# Patient Record
Sex: Female | Born: 1995 | Race: White | Hispanic: No | Marital: Single | Smoking: Current every day smoker
Health system: Southern US, Community
[De-identification: ages and names within clinical notes are randomized; demographics above are authoritative.]

## PROBLEM LIST (undated history)

## (undated) DIAGNOSIS — F32A Depression, unspecified: Secondary | ICD-10-CM

## (undated) DIAGNOSIS — X838XXA Intentional self-harm by other specified means, initial encounter: Secondary | ICD-10-CM

## (undated) DIAGNOSIS — R110 Nausea: Principal | ICD-10-CM

## (undated) DIAGNOSIS — N926 Irregular menstruation, unspecified: Principal | ICD-10-CM

## (undated) DIAGNOSIS — R102 Pelvic and perineal pain: Secondary | ICD-10-CM

## (undated) DIAGNOSIS — F419 Anxiety disorder, unspecified: Secondary | ICD-10-CM

## (undated) DIAGNOSIS — F329 Major depressive disorder, single episode, unspecified: Secondary | ICD-10-CM

## (undated) DIAGNOSIS — R1031 Right lower quadrant pain: Secondary | ICD-10-CM

## (undated) DIAGNOSIS — R519 Headache, unspecified: Secondary | ICD-10-CM

## (undated) DIAGNOSIS — R51 Headache: Secondary | ICD-10-CM

## (undated) DIAGNOSIS — E669 Obesity, unspecified: Secondary | ICD-10-CM

## (undated) DIAGNOSIS — Z3490 Encounter for supervision of normal pregnancy, unspecified, unspecified trimester: Secondary | ICD-10-CM

## (undated) DIAGNOSIS — N912 Amenorrhea, unspecified: Principal | ICD-10-CM

## (undated) DIAGNOSIS — S8290XA Unspecified fracture of unspecified lower leg, initial encounter for closed fracture: Secondary | ICD-10-CM

## (undated) DIAGNOSIS — R350 Frequency of micturition: Secondary | ICD-10-CM

## (undated) HISTORY — DX: Right lower quadrant pain: R10.31

## (undated) HISTORY — DX: Unspecified fracture of unspecified lower leg, initial encounter for closed fracture: S82.90XA

## (undated) HISTORY — DX: Intentional self-harm by other specified means, initial encounter: X83.8XXA

## (undated) HISTORY — PX: WISDOM TOOTH EXTRACTION: SHX21

## (undated) HISTORY — DX: Irregular menstruation, unspecified: N92.6

## (undated) HISTORY — DX: Nausea: R11.0

## (undated) HISTORY — DX: Encounter for supervision of normal pregnancy, unspecified, unspecified trimester: Z34.90

## (undated) HISTORY — DX: Amenorrhea, unspecified: N91.2

## (undated) HISTORY — DX: Pelvic and perineal pain: R10.2

## (undated) HISTORY — DX: Major depressive disorder, single episode, unspecified: F32.9

## (undated) HISTORY — DX: Frequency of micturition: R35.0

## (undated) HISTORY — DX: Depression, unspecified: F32.A

---
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
## 2015-01-28 ENCOUNTER — Ambulatory Visit: Payer: Self-pay | Admitting: Nurse Practitioner

## 2015-01-30 ENCOUNTER — Encounter: Payer: Self-pay | Admitting: Nurse Practitioner

## 2015-01-30 ENCOUNTER — Ambulatory Visit (INDEPENDENT_AMBULATORY_CARE_PROVIDER_SITE_OTHER): Payer: Medicaid Other | Admitting: Nurse Practitioner

## 2015-01-30 VITALS — BP 124/80 | HR 92 | Temp 97.5°F | Ht 64.0 in | Wt 235.0 lb

## 2015-01-30 DIAGNOSIS — R1084 Generalized abdominal pain: Secondary | ICD-10-CM

## 2015-01-30 DIAGNOSIS — K2210 Ulcer of esophagus without bleeding: Secondary | ICD-10-CM | POA: Insufficient documentation

## 2015-01-30 MED ORDER — DEXLANSOPRAZOLE 60 MG PO CPDR
60.0000 mg | DELAYED_RELEASE_CAPSULE | Freq: Every day | ORAL | Status: DC
Start: 2015-01-30 — End: 2015-03-13

## 2015-01-30 NOTE — Progress Notes (Signed)
Referring Provider: Chevis Pretty, * Primary Care Physician:  Chevis Pretty, FNP Primary GI:  Dr. Gala Romney  Chief Complaint  Patient presents with  . Follow-up    HPI:   19 year old female presents for follow-up on epigastric pain, nausea, vomiting. At last visit she was referred for endoscopy which was completed on 12/12/2014 under conscious sedation. Findings include ulcerative reflux esophagitis likely responsible for GI symptoms. Advised stop Prilosec and begin Dexalone 60 mg daily, finished current prescription of Carafate, and follow-up for office visit.  Today she states she is taking Dexilant. Her abdominal pain is improved somewhat "I can finally sleep on my stomach." Is still having some residual abdominal pain across the anterior mid-abdomen. This pain is different. It is described as cramping. The pain is intermittent and is improved with a bowel movement. States she has a bowel movement about every 2 days which changed about a month ago at which point she was going about 5 times a day. No changes in medications between then and now other than PPI regimen. Her bowel movements currently are looser and require straining. Denies hemaochezia and melena. Admits vomiting one episode yesterday which is the first time since starting Dexilant. Admits new chest pain 2 days ago. Denies dyspnea, dizziness, lightheadedness, syncope, near syncope. Denies any other upper or lower GI symptoms.  Past Medical History  Diagnosis Date  . Obesity   . Broken leg     left  . Depression   . Suicide   . Amenorrhea 10/02/2014  . RLQ abdominal pain 10/02/2014    Past Surgical History  Procedure Laterality Date  . No past surgeries      Current as of 11/28/14  . Esophagogastroduodenoscopy N/A 12/12/2014    [id number] reflux     Current Outpatient Prescriptions  Medication Sig Dispense Refill  . albuterol (PROAIR HFA) 108 (90 BASE) MCG/ACT inhaler Inhale into the lungs every 6  (six) hours as needed for wheezing or shortness of breath.    . norethindrone (MICRONOR,CAMILA,ERRIN) 0.35 MG tablet 1 tablet by mouth at same time daily 1 Package 11  . omeprazole (PRILOSEC) 20 MG capsule Take 1 capsule (20 mg total) by mouth 2 (two) times daily before a meal. 30 capsule 2  . sucralfate (CARAFATE) 1 GM/10ML suspension Take 10 mLs (1 g total) by mouth 4 (four) times daily as needed. 420 mL 1   No current facility-administered medications for this visit.    Allergies as of 01/30/2015 - Review Complete 01/30/2015  Allergen Reaction Noted  . Tylenol [acetaminophen] Other (See Comments) 09/14/2013    Family History  Problem Relation Age of Onset  . Hypertension Mother   . Stroke Paternal Aunt   . Diabetes Maternal Grandmother   . Heart disease Maternal Grandmother   . Hyperlipidemia Maternal Grandmother   . Drug abuse Maternal Grandmother   . Glaucoma Maternal Grandmother   . Cataracts Maternal Grandmother   . Cancer Paternal Grandfather   . Psoriasis Sister   . Cervical cancer Maternal Aunt   . Physical abuse Maternal Uncle   . Prostate cancer Maternal Grandfather   . Alcohol abuse Maternal Grandfather   . Obesity Other   . Bipolar disorder Other     paternal aunt and cousin  . Drug abuse Other   . Other Father     heart issues  . Heart attack Father   . Colon cancer Paternal Grandfather     History   Social History  . Marital Status: Single  Spouse Name: N/A  . Number of Children: N/A  . Years of Education: N/A   Occupational History  . student     9th grade at [employer]   Social History Main Topics  . Smoking status: Former Smoker -- 1.00 packs/day for 2 years    Types: Cigarettes    Quit date: 11/27/2012  . Smokeless tobacco: Former Systems developer    Types: Chew     Comment: Currently uses vapes  . Alcohol Use: No  . Drug Use: No  . Sexual Activity:    Partners: Male    Birth Control/ Protection: Pill   Other Topics Concern  . None   Social  History Narrative    Review of Systems: General: Negative for anorexia, weight loss, fever, chills, fatigue, weakness. Eyes: Negative for vision changes.  ENT: Negative for hoarseness, difficulty swallowing. CV: Negative for chest pain, angina, palpitations, dyspnea on exertion, peripheral edema.  Respiratory: Negative for dyspnea at rest, dyspnea on exertion, cough, sputum, wheezing.  GI: See history of present illness. Endo: Negative for unusual weight change.    Physical Exam: BP 124/80 mmHg  Pulse 92  Temp(Src) 97.5 F (36.4 C)  Ht 5\' 4"  (1.626 m)  Wt 235 lb (106.595 kg)  BMI 40.32 kg/m2  LMP 12/18/2014 General:   Alert and oriented. Pleasant and cooperative. Well-nourished and well-developed.  Head:  Normocephalic and atraumatic. Eyes:  Without icterus, sclera clear and conjunctiva pink.  Ears:  Normal auditory acuity. Cardiovascular:  S1, S2 present without murmurs appreciated. Normal pulses noted. Extremities without clubbing or edema. Respiratory:  Clear to auscultation bilaterally. No wheezes, rales, or rhonchi. No distress.  Gastrointestinal:  +BS, soft, and non-distended. Minimal lower abdominal TTP noted. No HSM noted. No guarding or rebound. No masses appreciated.  Rectal:  Deferred  Neurologic:  Alert and oriented x4;  grossly normal neurologically. Psych:  Alert and cooperative. Normal mood and affect. Heme/Lymph/Immune: No excessive bruising noted.    01/30/2015 1:33 PM

## 2015-01-30 NOTE — Assessment & Plan Note (Signed)
Patient with ulcerative reflux esophagitis on EGD. She was taken off omeprazole and started on Dexilant 60 mg daily. She continues to take this. States her symptoms have essentially resolved. Her abdominal pain is in a different location and is different at this office visit. Continue Dexilant. Return for follow-up in 3 months.

## 2015-01-30 NOTE — Patient Instructions (Signed)
1. Start taking Benefiber supplement 2 teaspoons every day. 2. Continue taking Dexilant as prescribed. 3. Return for further evaluation in 3 months or sooner if symptoms worsen or become severe.

## 2015-01-30 NOTE — Assessment & Plan Note (Signed)
Patient with lower abdominal pain which is different from her epigastric pain when she was seen at last visit and subsequently scheduled for endoscopy. Currently her abdominal pain is crampy is relieved by bowel movement. She previously was having a bowel movement one to 2 times a day however currently is only having one at most every other day. Her stools are somewhat softer/loose however has a sensation of incomplete emptying. At this point we will add Benefiber supplement to attempt a to bulk her stools and allow for complete emptying. We will have her return in 3 months for further evaluation.

## 2015-01-31 NOTE — Progress Notes (Signed)
CC'ED TO PCP 

## 2015-02-12 ENCOUNTER — Telehealth: Payer: Self-pay

## 2015-02-12 MED ORDER — PANTOPRAZOLE SODIUM 40 MG PO TBEC: 40.0000 mg | DELAYED_RELEASE_TABLET | Freq: Every day | ORAL | Status: DC

## 2015-02-12 NOTE — Telephone Encounter (Signed)
Received a fax from the pharmacy, pt needs a PA done for dexilant. Pt has [place] medicaid and has only tried and failed omeprazole. Pt will have to try and fail pantoprazole prior to getting approved for dexilant.

## 2015-02-12 NOTE — Telephone Encounter (Signed)
I have sent in Protonix. Let's try that. If she does well with this, could just continue Protonix.

## 2015-02-12 NOTE — Telephone Encounter (Signed)
Pt aware. Will call us if Protonix doesn't work

## 2015-03-11 ENCOUNTER — Telehealth: Payer: Self-pay | Admitting: Adult Health

## 2015-03-11 NOTE — Telephone Encounter (Signed)
She thinks she is pregnant, to make appt for UPT, she says HPT negative

## 2015-03-13 ENCOUNTER — Ambulatory Visit (INDEPENDENT_AMBULATORY_CARE_PROVIDER_SITE_OTHER): Payer: Medicaid Other | Admitting: Adult Health

## 2015-03-13 ENCOUNTER — Encounter: Payer: Self-pay | Admitting: Adult Health

## 2015-03-13 VITALS — BP 110/62 | HR 84 | Ht 64.0 in | Wt 241.0 lb

## 2015-03-13 DIAGNOSIS — R350 Frequency of micturition: Secondary | ICD-10-CM | POA: Insufficient documentation

## 2015-03-13 DIAGNOSIS — R110 Nausea: Secondary | ICD-10-CM | POA: Diagnosis not present

## 2015-03-13 DIAGNOSIS — Z3202 Encounter for pregnancy test, result negative: Secondary | ICD-10-CM | POA: Diagnosis not present

## 2015-03-13 HISTORY — DX: Frequency of micturition: R35.0

## 2015-03-13 HISTORY — DX: Nausea: R11.0

## 2015-03-13 LAB — POCT URINALYSIS DIPSTICK
Blood, UA: NEGATIVE
GLUCOSE UA: NEGATIVE
Leukocytes, UA: NEGATIVE
NITRITE UA: NEGATIVE
Protein, UA: NEGATIVE

## 2015-03-13 LAB — POCT URINE PREGNANCY: Preg Test, Ur: NEGATIVE

## 2015-03-13 MED ORDER — PRENATAL PLUS 27-1 MG PO TABS: 1.0000 | ORAL_TABLET | Freq: Every day | ORAL | Status: DC

## 2015-03-13 NOTE — Progress Notes (Signed)
Subjective:     Patient ID: Sonya Padilla, female   DOB: 1995-10-11, 19 y.o.   MRN: [id number]  HPI Sonya Padilla is a 19 year old white female in for UPT, has had negative HPT but feels like she did when pregnant with daughter,she stopped OCs about a week ago, has nausea and occasional vomiting, urinary frequency and is hungry all the time.Has seen GI and he told her ulcer has healed.  Review of Systems Patient denies any headaches, hearing loss, fatigue, blurred vision, shortness of breath, chest pain, abdominal pain, problems with bowel movement or intercourse. No joint pain or mood swings.See HPI for positives. Reviewed past medical,surgical, social and family history. Reviewed medications and allergies.     Objective:   Physical Exam BP 110/62 mmHg  Pulse 84  Ht 5\' 4"  (1.626 m)  Wt 241 lb (109.317 kg)  BMI 41.35 kg/m2  LMP 08/27/2016urine negative, UPT negative,Skin warm and dry. Lungs: clear to ausculation bilaterally. Cardiovascular: regular rate and rhythm.Will check labs.    Assessment:     Nausea  Urinary frequency     Plan:     Check CBC,CMP,TSH and QHCG Rx prenatal plus #30 take 1 daily with 11 refills

## 2015-03-13 NOTE — Patient Instructions (Signed)
Will talk when labs back Take prenatals

## 2015-03-14 ENCOUNTER — Telehealth: Payer: Self-pay | Admitting: Adult Health

## 2015-03-14 LAB — COMPREHENSIVE METABOLIC PANEL
ALT: 19 IU/L (ref 0–32)
AST: 17 IU/L (ref 0–40)
Albumin/Globulin Ratio: 1.7 (ref 1.1–2.5)
Albumin: 4.3 g/dL (ref 3.5–5.5)
Alkaline Phosphatase: 111 IU/L — ABNORMAL HIGH (ref 43–101)
BUN/Creatinine Ratio: 13 (ref 8–20)
BUN: 9 mg/dL (ref 6–20)
Bilirubin Total: 0.4 mg/dL (ref 0.0–1.2)
CO2: 23 mmol/L (ref 18–29)
CREATININE: 0.71 mg/dL (ref 0.57–1.00)
Calcium: 9.4 mg/dL (ref 8.7–10.2)
Chloride: 101 mmol/L (ref 97–108)
GFR calc Af Amer: 144 mL/min/{1.73_m2} (ref >59)
GFR calc non Af Amer: 125 mL/min/{1.73_m2} (ref >59)
GLOBULIN, TOTAL: 2.5 g/dL (ref 1.5–4.5)
Glucose: 110 mg/dL — ABNORMAL HIGH (ref 65–99)
Potassium: 3.9 mmol/L (ref 3.5–5.2)
SODIUM: 139 mmol/L (ref 134–144)
Total Protein: 6.8 g/dL (ref 6.0–8.5)

## 2015-03-14 LAB — TSH: TSH: 2.71 u[IU]/mL (ref 0.450–4.500)

## 2015-03-14 LAB — CBC
Hematocrit: 39.2 % (ref 34.0–46.6)
Hemoglobin: 13.3 g/dL (ref 11.1–15.9)
MCH: 29.4 pg (ref 26.6–33.0)
MCHC: 33.9 g/dL (ref 31.5–35.7)
MCV: 87 fL (ref 79–97)
Platelets: 264 10*3/uL (ref 150–379)
RBC: 4.53 x10E6/uL (ref 3.77–5.28)
RDW: 13.6 % (ref 12.3–15.4)
WBC: 6.7 10*3/uL (ref 3.4–10.8)

## 2015-03-14 LAB — BETA HCG QUANT (REF LAB): hCG Quant: <1 m[IU]/mL

## 2015-03-14 NOTE — Telephone Encounter (Signed)
Spoke with pt letting her know her quant was negative. Pt voiced understanding. Sonya Padilla

## 2015-04-30 ENCOUNTER — Ambulatory Visit (INDEPENDENT_AMBULATORY_CARE_PROVIDER_SITE_OTHER): Payer: Medicaid Other | Admitting: Family Medicine

## 2015-04-30 ENCOUNTER — Encounter: Payer: Self-pay | Admitting: Family Medicine

## 2015-04-30 VITALS — BP 134/75 | HR 81 | Temp 97.4°F | Ht 64.01 in | Wt 235.0 lb

## 2015-04-30 DIAGNOSIS — N898 Other specified noninflammatory disorders of vagina: Secondary | ICD-10-CM | POA: Insufficient documentation

## 2015-04-30 DIAGNOSIS — N912 Amenorrhea, unspecified: Secondary | ICD-10-CM

## 2015-04-30 LAB — POCT WET PREP (WET MOUNT)
KOH WET PREP POC: POSITIVE
TRICHOMONAS WET PREP HPF POC: NEGATIVE

## 2015-04-30 LAB — POCT URINE PREGNANCY: Preg Test, Ur: NEGATIVE

## 2015-04-30 MED ORDER — FLUCONAZOLE 150 MG PO TABS: 150.0000 mg | ORAL_TABLET | Freq: Once | ORAL | Status: DC

## 2015-04-30 NOTE — Patient Instructions (Signed)
Great to meet you!  We will let you know about your labs within 1 week, hopefully sooner.

## 2015-04-30 NOTE — Progress Notes (Signed)
   HPI  Patient presents today with concern for pregnancy.  Patient explains that she is sexually active with her husband without any contraception. She has stopped her contraception about 2 months ago. Her last period was around September 30. She reports she has symptoms of pregnancy with breast tenderness, nausea with certain foods, morning sickness, and amenorrhea.  She had a positive pregnancy test at home 2 nights ago and had a negative test last night she comes in requesting a blood test to confirm.  He also notes 2 weeks of foul-smelling thick white vaginal discharge without much itching.  PMH: Smoking status noted ROS: Per HPI  Objective: BP 134/75 mmHg  Pulse 81  Temp(Src) 97.4 F (36.3 C) (Oral)  Ht 5' 4.01" (1.626 m)  Wt 235 lb (106.595 kg)  BMI 40.32 kg/m2 Gen: NAD, alert, cooperative with exam HEENT: NCAT CV: RRR, good S1/S2, no murmur Resp: CTABL, no wheezes, non-labored Ext: No edema, warm Neuro: Alert and oriented, No gross deficits  Assessment and plan:  # Amenorrhea, concern for Urine pregnancy test negative today Follow-up with  Beta quant with recent positive test at home.   # Vaginal discharge Wet prep positive for yeas, diflucan sent   Orders Placed This Encounter  Procedures  . Beta HCG, Quant (tumor marker)  . POCT urine pregnancy  . POCT Wet Prep [facility])    Meds ordered this encounter  Medications  . fluconazole (DIFLUCAN) 150 MG tablet    Sig: Take 1 tablet (150 mg total) by mouth once. And repeat in 3 days    Dispense:  2 tablet    Refill:  [place], MD [facility] 04/30/2015, 6:16 PM

## 2015-05-01 ENCOUNTER — Encounter: Payer: Self-pay | Admitting: Nurse Practitioner

## 2015-05-01 ENCOUNTER — Ambulatory Visit (INDEPENDENT_AMBULATORY_CARE_PROVIDER_SITE_OTHER): Payer: Medicaid Other | Admitting: Nurse Practitioner

## 2015-05-01 VITALS — BP 119/69 | HR 79 | Temp 97.8°F | Ht 65.0 in | Wt 239.8 lb

## 2015-05-01 DIAGNOSIS — K2100 Gastro-esophageal reflux disease with esophagitis, without bleeding: Secondary | ICD-10-CM

## 2015-05-01 DIAGNOSIS — K2210 Ulcer of esophagus without bleeding: Secondary | ICD-10-CM

## 2015-05-01 NOTE — Progress Notes (Signed)
Referring Provider: Chevis Pretty, * Primary Care Physician:  Chevis Pretty, FNP Primary GI:  Dr. Gala Romney  Chief Complaint  Patient presents with  . Follow-up    Doing better    HPI:   19 year old female presents for follow-up on abdominal pain and gastritis. At her last office visit her abdominal pain was resolving. We added Benefiber to promote improved stool regularity.  Today she states she's doing better. Symptoms are essentially resolved. Is on Dexilant, occasionally has breakthrough symptoms with foods that she knows will upset her symptoms but she chooses to eat them. Her breakthrough symptoms include epigastric pain. At first she states she's taking Dexilant 3 times a day, but when asked again she says "I don't know, I'm just really tired." Thinks she's pregnant, has had one positive and one negative at home test. Has had confirmation bloodwork drawn, will know in a week. Constipation improved, having daily bowel movements. No straining, denies hematochezia and melena. Takes Benefiber as needed with good result.Denies chest pain, dyspnea, dizziness, lightheadedness, syncope, near syncope. Denies any other upper or lower GI symptoms.  Past Medical History  Diagnosis Date  . Obesity   . Broken leg     left  . Depression   . Suicide ([place])   . Amenorrhea 10/02/2014  . RLQ abdominal pain 10/02/2014  . Nausea 03/13/2015  . Urinary frequency 03/13/2015    Past Surgical History  Procedure Laterality Date  . No past surgeries      Current as of 11/28/14  . Esophagogastroduodenoscopy N/A 12/12/2014    [id number] reflux     Current Outpatient Prescriptions  Medication Sig Dispense Refill  . albuterol (PROAIR HFA) 108 (90 BASE) MCG/ACT inhaler Inhale into the lungs every 6 (six) hours as needed for wheezing or shortness of breath.    . dexlansoprazole (DEXILANT) 60 MG capsule Take 60 mg by mouth daily.    . fluconazole (DIFLUCAN) 150 MG tablet Take 1 tablet (150  mg total) by mouth once. And repeat in 3 days 2 tablet 0  . pantoprazole (PROTONIX) 40 MG tablet Take 1 tablet (40 mg total) by mouth daily. (Patient not taking: Reported on 04/30/2015) 90 tablet 3   No current facility-administered medications for this visit.    Allergies as of 05/01/2015 - Review Complete 05/01/2015  Allergen Reaction Noted  . Tylenol [acetaminophen] Other (See Comments) 09/14/2013    Family History  Problem Relation Age of Onset  . Hypertension Mother   . Stroke Paternal Aunt   . Diabetes Maternal Grandmother   . Heart disease Maternal Grandmother   . Hyperlipidemia Maternal Grandmother   . Drug abuse Maternal Grandmother   . Glaucoma Maternal Grandmother   . Cataracts Maternal Grandmother   . Cancer Paternal Grandfather   . Psoriasis Sister   . Cervical cancer Maternal Aunt   . Physical abuse Maternal Uncle   . Prostate cancer Maternal Grandfather   . Alcohol abuse Maternal Grandfather   . Obesity Other   . Bipolar disorder Other     paternal aunt and cousin  . Drug abuse Other   . Other Father     heart issues  . Heart attack Father   . Colon cancer Paternal Grandfather     Social History   Social History  . Marital Status: Single    Spouse Name: N/A  . Number of Children: N/A  . Years of Education: N/A   Occupational History  . student     9th grade at  [employer]   Social History Main Topics  . Smoking status: Former Smoker -- 1.00 packs/day for 2 years    Types: Cigarettes    Quit date: 11/27/2012  . Smokeless tobacco: Former Systems developer    Types: Chew     Comment: Currently uses vapes  . Alcohol Use: No  . Drug Use: No  . Sexual Activity:    Partners: Male    Patent examiner Protection: None, Condom   Other Topics Concern  . None   Social History Narrative    Review of Systems: General: Negative for anorexia, weight loss, fever, chills, fatigue, weakness. CV: Negative for chest pain, angina, palpitations, peripheral edema.    Respiratory: Negative for dyspnea at rest, cough, sputum, wheezing.  GI: See history of present illness. Endo: Negative for unusual weight change.     Physical Exam: BP 119/69 mmHg  Pulse 79  Temp(Src) 97.8 F (36.6 C) (Oral)  Ht 5\' 5"  (1.651 m)  Wt 239 lb 12.8 oz (108.773 kg)  BMI 39.90 kg/m2  LMP 04/09/2015 General:   Alert and oriented. Obese female. Pleasant and cooperative. Well-nourished and well-developed.  Head:  Normocephalic and atraumatic. Ears:  Normal auditory acuity. Cardiovascular:  S1, S2 present without murmurs appreciated. Extremities without clubbing or edema. Respiratory:  Clear to auscultation bilaterally. No wheezes, rales, or rhonchi. No distress.  Gastrointestinal:  +BS, soft, non-tender and non-distended. No HSM noted. No guarding or rebound. No masses appreciated.  Rectal:  Deferred  Musculoskalatal:  Symmetrical without gross deformities. Normal posture. Neurologic:  Alert and oriented x4;  grossly normal neurologically. Psych:  Alert and cooperative. Normal mood and affect.    05/01/2015 2:09 PM

## 2015-05-01 NOTE — Progress Notes (Signed)
CC'ED TO PCP 

## 2015-05-01 NOTE — Patient Instructions (Signed)
1. Take Dexilant 60 mg once a day. 2. Try to avoid trigger foods aggravate your symptoms. 3. Return for follow-up in 6 months.

## 2015-05-01 NOTE — Assessment & Plan Note (Signed)
History of ulcerative esophagitis on endoscopy completed 12/12/2014 with recommendation for Dexilant 60 mg daily. Breakthrough symptoms which are good foods as noted above. Return for follow-up in 6 months.

## 2015-05-01 NOTE — Assessment & Plan Note (Signed)
History of GERD, has break through symptoms with known trigger foods that she chooses to eat. Advised her to avoid trigger foods. Take Dexon 60 mg once daily, she was unsure how often she is taking it currently. Follow-up in 6 months.

## 2015-05-02 LAB — BETA HCG QUANT (REF LAB): hCG Quant: <1 m[IU]/mL

## 2015-05-13 ENCOUNTER — Ambulatory Visit (INDEPENDENT_AMBULATORY_CARE_PROVIDER_SITE_OTHER): Payer: Medicaid Other | Admitting: Adult Health

## 2015-05-13 ENCOUNTER — Encounter: Payer: Self-pay | Admitting: Adult Health

## 2015-05-13 VITALS — BP 112/80 | HR 88 | Ht 63.4 in | Wt 239.0 lb

## 2015-05-13 DIAGNOSIS — R102 Pelvic and perineal pain: Secondary | ICD-10-CM

## 2015-05-13 DIAGNOSIS — Z3490 Encounter for supervision of normal pregnancy, unspecified, unspecified trimester: Secondary | ICD-10-CM

## 2015-05-13 DIAGNOSIS — N926 Irregular menstruation, unspecified: Secondary | ICD-10-CM

## 2015-05-13 DIAGNOSIS — Z3201 Encounter for pregnancy test, result positive: Secondary | ICD-10-CM

## 2015-05-13 DIAGNOSIS — O3680X0 Pregnancy with inconclusive fetal viability, not applicable or unspecified: Secondary | ICD-10-CM

## 2015-05-13 HISTORY — DX: Irregular menstruation, unspecified: N92.6

## 2015-05-13 HISTORY — DX: Pelvic and perineal pain: R10.2

## 2015-05-13 HISTORY — DX: Encounter for supervision of normal pregnancy, unspecified, unspecified trimester: Z34.90

## 2015-05-13 LAB — POCT URINE PREGNANCY: Preg Test, Ur: POSITIVE — AB

## 2015-05-13 MED ORDER — CITRANATAL 90 DHA 90-1 & 300 MG PO MISC: ORAL | Status: DC

## 2015-05-13 NOTE — Progress Notes (Signed)
Subjective:     Patient ID: Sonya Padilla, female   DOB: Aug 21, 1995, 20 y.o.   MRN: [id number]  HPI Sonya Padilla is a 19 year old white female in complaining of irregular periods had 2 in September, but none in October, was seen at [facility] and had negative UPT,has had 1 + and 1 negative at home.Has some low pelvic pain.  Review of Systems Patient denies any headaches, hearing loss, fatigue, blurred vision, shortness of breath, chest pain,  problems with bowel movements, urination, or intercourse. No joint pain or mood swings.See HPI for positives. Reviewed past medical,surgical, social and family history. Reviewed medications and allergies.     Objective:   Physical Exam BP 112/80 mmHg  Pulse 88  Ht 5' 3.4" (1.61 m)  Wt 239 lb (108.41 kg)  BMI 41.82 kg/m2  LMP 04/09/2015 UPT+ Skin warm and dry. Neck: mid line trachea, normal thyroid, good ROM, no lymphadenopathy noted. Lungs: clear to ausculation bilaterally. Cardiovascular: regular rate and rhythm.Abdomen soft, no HSM but tender across low pelvic area,about 4+6 weeks by LMP with EDD 01/14/16.    Assessment:     Irregular periods Pregnant  Pelvic pain    Plan:     Check QHCG Return in 2 weeks for dating [place] Rx citranatal 90 DHA #60 1 bid with 11 refills Review handout on first trimester

## 2015-05-13 NOTE — Patient Instructions (Signed)
First Trimester of Pregnancy The first trimester of pregnancy is from week 1 until the end of week 12 (months 1 through 3). A week after a sperm fertilizes an egg, the egg will implant on the wall of the uterus. This embryo will begin to develop into a baby. Genes from you and your partner are forming the baby. The female genes determine whether the baby is a boy or a girl. At 6-8 weeks, the eyes and face are formed, and the heartbeat can be seen on ultrasound. At the end of 12 weeks, all the baby's organs are formed.  Now that you are pregnant, you will want to do everything you can to have a healthy baby. Two of the most important things are to get good prenatal care and to follow your health care provider's instructions. Prenatal care is all the medical care you receive before the baby's birth. This care will help prevent, find, and treat any problems during the pregnancy and childbirth. BODY CHANGES Your body goes through many changes during pregnancy. The changes vary from woman to woman.   You may gain or lose a couple of pounds at first.  You may feel sick to your stomach (nauseous) and throw up (vomit). If the vomiting is uncontrollable, call your health care provider.  You may tire easily.  You may develop headaches that can be relieved by medicines approved by your health care provider.  You may urinate more often. Painful urination may mean you have a bladder infection.  You may develop heartburn as a result of your pregnancy.  You may develop constipation because certain hormones are causing the muscles that push waste through your intestines to slow down.  You may develop hemorrhoids or swollen, bulging veins (varicose veins).  Your breasts may begin to grow larger and become tender. Your nipples may stick out more, and the tissue that surrounds them (areola) may become darker.  Your gums may bleed and may be sensitive to brushing and flossing.  Dark spots or blotches (chloasma,  mask of pregnancy) may develop on your face. This will likely fade after the baby is born.  Your menstrual periods will stop.  You may have a loss of appetite.  You may develop cravings for certain kinds of food.  You may have changes in your emotions from day to day, such as being excited to be pregnant or being concerned that something may go wrong with the pregnancy and baby.  You may have more vivid and strange dreams.  You may have changes in your hair. These can include thickening of your hair, rapid growth, and changes in texture. Some women also have hair loss during or after pregnancy, or hair that feels dry or thin. Your hair will most likely return to normal after your baby is born. WHAT TO EXPECT AT YOUR PRENATAL VISITS During a routine prenatal visit:  You will be weighed to make sure you and the baby are growing normally.  Your blood pressure will be taken.  Your abdomen will be measured to track your baby's growth.  The fetal heartbeat will be listened to starting around week 10 or 12 of your pregnancy.  Test results from any previous visits will be discussed. Your health care provider may ask you:  How you are feeling.  If you are feeling the baby move.  If you have had any abnormal symptoms, such as leaking fluid, bleeding, severe headaches, or abdominal cramping.  If you are using any tobacco products,   including cigarettes, chewing tobacco, and electronic cigarettes.  If you have any questions. Other tests that may be performed during your first trimester include:  Blood tests to find your blood type and to check for the presence of any previous infections. They will also be used to check for low iron levels (anemia) and Rh antibodies. Later in the pregnancy, blood tests for diabetes will be done along with other tests if problems develop.  Urine tests to check for infections, diabetes, or protein in the urine.  An ultrasound to confirm the proper growth  and development of the baby.  An amniocentesis to check for possible genetic problems.  Fetal screens for spina bifida and Down syndrome.  You may need other tests to make sure you and the baby are doing well.  HIV (human immunodeficiency virus) testing. Routine prenatal testing includes screening for HIV, unless you choose not to have this test. HOME CARE INSTRUCTIONS  Medicines  Follow your health care provider's instructions regarding medicine use. Specific medicines may be either safe or unsafe to take during pregnancy.  Take your prenatal vitamins as directed.  If you develop constipation, try taking a stool softener if your health care provider approves. Diet  Eat regular, well-balanced meals. Choose a variety of foods, such as meat or vegetable-based protein, fish, milk and low-fat dairy products, vegetables, fruits, and whole grain breads and cereals. Your health care provider will help you determine the amount of weight gain that is right for you.  Avoid raw meat and uncooked cheese. These carry germs that can cause birth defects in the baby.  Eating four or five small meals rather than three large meals a day may help relieve nausea and vomiting. If you start to feel nauseous, eating a few soda crackers can be helpful. Drinking liquids between meals instead of during meals also seems to help nausea and vomiting.  If you develop constipation, eat more high-fiber foods, such as fresh vegetables or fruit and whole grains. Drink enough fluids to keep your urine clear or pale yellow. Activity and Exercise  Exercise only as directed by your health care provider. Exercising will help you:  Control your weight.  Stay in shape.  Be prepared for labor and delivery.  Experiencing pain or cramping in the lower abdomen or low back is a good sign that you should stop exercising. Check with your health care provider before continuing normal exercises.  Try to avoid standing for long  periods of time. Move your legs often if you must stand in one place for a long time.  Avoid heavy lifting.  Wear low-heeled shoes, and practice good posture.  You may continue to have sex unless your health care provider directs you otherwise. Relief of Pain or Discomfort  Wear a good support bra for breast tenderness.   Take warm sitz baths to soothe any pain or discomfort caused by hemorrhoids. Use hemorrhoid cream if your health care provider approves.   Rest with your legs elevated if you have leg cramps or low back pain.  If you develop varicose veins in your legs, wear support hose. Elevate your feet for 15 minutes, 3-4 times a day. Limit salt in your diet. Prenatal Care  Schedule your prenatal visits by the twelfth week of pregnancy. They are usually scheduled monthly at first, then more often in the last 2 months before delivery.  Write down your questions. Take them to your prenatal visits.  Keep all your prenatal visits as directed by your   health care provider. Safety  Wear your seat belt at all times when driving.  Make a list of emergency phone numbers, including numbers for family, friends, the hospital, and police and fire departments. General Tips  Ask your health care provider for a referral to a local prenatal education class. Begin classes no later than at the beginning of month 6 of your pregnancy.  Ask for help if you have counseling or nutritional needs during pregnancy. Your health care provider can offer advice or refer you to specialists for help with various needs.  Do not use hot tubs, steam rooms, or saunas.  Do not douche or use tampons or scented sanitary pads.  Do not cross your legs for long periods of time.  Avoid cat litter boxes and soil used by cats. These carry germs that can cause birth defects in the baby and possibly loss of the fetus by miscarriage or stillbirth.  Avoid all smoking, herbs, alcohol, and medicines not prescribed by  your health care provider. Chemicals in these affect the formation and growth of the baby.  Do not use any tobacco products, including cigarettes, chewing tobacco, and electronic cigarettes. If you need help quitting, ask your health care provider. You may receive counseling support and other resources to help you quit.  Schedule a dentist appointment. At home, brush your teeth with a soft toothbrush and be gentle when you floss. SEEK MEDICAL CARE IF:   You have dizziness.  You have mild pelvic cramps, pelvic pressure, or nagging pain in the abdominal area.  You have persistent nausea, vomiting, or diarrhea.  You have a bad smelling vaginal discharge.  You have pain with urination.  You notice increased swelling in your face, hands, legs, or ankles. SEEK IMMEDIATE MEDICAL CARE IF:   You have a fever.  You are leaking fluid from your vagina.  You have spotting or bleeding from your vagina.  You have severe abdominal cramping or pain.  You have rapid weight gain or loss.  You vomit blood or material that looks like coffee grounds.  You are exposed to [place] measles and have never had them.  You are exposed to fifth disease or chickenpox.  You develop a severe headache.  You have shortness of breath.  You have any kind of trauma, such as from a fall or a car accident.   This information is not intended to replace advice given to you by your health care provider. Make sure you discuss any questions you have with your health care provider.   Document Released: 06/23/2001 Document Revised: 07/20/2014 Document Reviewed: 05/09/2013 Elsevier Interactive Patient Education [employer]. Return in 2 weeks for dating [place]

## 2015-05-14 ENCOUNTER — Telehealth: Payer: Self-pay | Admitting: Adult Health

## 2015-05-14 DIAGNOSIS — Z3490 Encounter for supervision of normal pregnancy, unspecified, unspecified trimester: Secondary | ICD-10-CM

## 2015-05-14 LAB — BETA HCG QUANT (REF LAB): hCG Quant: 39 m[IU]/mL

## 2015-05-14 NOTE — Telephone Encounter (Signed)
left message QHCG 39 recheck in 48 hours

## 2015-05-20 ENCOUNTER — Telehealth: Payer: Self-pay | Admitting: Adult Health

## 2015-05-20 NOTE — Telephone Encounter (Signed)
She is having cramping no bleeding and is getting labs this pm,push fluids and she has cough and congestion,can use zyrtec,tylenol and any cough drop and salt water gargles.

## 2015-05-21 ENCOUNTER — Telehealth: Payer: Self-pay | Admitting: Adult Health

## 2015-05-21 LAB — BETA HCG QUANT (REF LAB): hCG Quant: 974 m[IU]/mL

## 2015-05-21 NOTE — Telephone Encounter (Signed)
Pt aware of [facility]

## 2015-05-29 ENCOUNTER — Other Ambulatory Visit: Payer: Self-pay | Admitting: Adult Health

## 2015-05-29 ENCOUNTER — Ambulatory Visit (INDEPENDENT_AMBULATORY_CARE_PROVIDER_SITE_OTHER): Payer: Medicaid Other

## 2015-05-29 DIAGNOSIS — O3680X0 Pregnancy with inconclusive fetal viability, not applicable or unspecified: Secondary | ICD-10-CM

## 2015-05-29 NOTE — Progress Notes (Signed)
[place] 6+1wks,single IUP w/ys,pos fht 100 bpm,normal ov's bilat, simple lt corpus luteal cyst 2.3 x 2 x 1.9cm ,crl 5.47mm,pt will come back in 1 wks to recheck FHR per [place].

## 2015-06-03 ENCOUNTER — Telehealth: Payer: Self-pay | Admitting: Nurse Practitioner

## 2015-06-05 ENCOUNTER — Other Ambulatory Visit: Payer: Self-pay | Admitting: Obstetrics & Gynecology

## 2015-06-05 ENCOUNTER — Telehealth: Payer: Self-pay | Admitting: *Deleted

## 2015-06-05 ENCOUNTER — Ambulatory Visit (INDEPENDENT_AMBULATORY_CARE_PROVIDER_SITE_OTHER): Payer: Medicaid Other

## 2015-06-05 DIAGNOSIS — O3680X0 Pregnancy with inconclusive fetal viability, not applicable or unspecified: Secondary | ICD-10-CM

## 2015-06-05 MED ORDER — ONDANSETRON 8 MG PO TBDP: 8.0000 mg | ORAL_TABLET | Freq: Three times a day (TID) | ORAL | Status: DC | PRN

## 2015-06-05 NOTE — Progress Notes (Addendum)
[place] 7+1wks single IUP w/ys,pos fht 99 bpm,normal rt ov,lt simple corpus luteal cyst 2.4 x 1.7 x 1.8cm,pt does not need a f/u ultrasound for fhr,per Dr. Elonda Husky.

## 2015-06-12 ENCOUNTER — Encounter: Payer: Self-pay | Admitting: Women's Health

## 2015-06-12 ENCOUNTER — Ambulatory Visit (INDEPENDENT_AMBULATORY_CARE_PROVIDER_SITE_OTHER): Payer: Medicaid Other | Admitting: Women's Health

## 2015-06-12 VITALS — BP 122/64 | HR 72 | Ht 65.0 in | Wt 242.0 lb

## 2015-06-12 DIAGNOSIS — Z3682 Encounter for antenatal screening for nuchal translucency: Secondary | ICD-10-CM

## 2015-06-12 DIAGNOSIS — Z369 Encounter for antenatal screening, unspecified: Secondary | ICD-10-CM

## 2015-06-12 DIAGNOSIS — Z1389 Encounter for screening for other disorder: Secondary | ICD-10-CM

## 2015-06-12 DIAGNOSIS — Z0283 Encounter for blood-alcohol and blood-drug test: Secondary | ICD-10-CM

## 2015-06-12 DIAGNOSIS — Z3491 Encounter for supervision of normal pregnancy, unspecified, first trimester: Secondary | ICD-10-CM | POA: Diagnosis not present

## 2015-06-12 DIAGNOSIS — Z3A08 8 weeks gestation of pregnancy: Secondary | ICD-10-CM | POA: Diagnosis not present

## 2015-06-12 DIAGNOSIS — Z3490 Encounter for supervision of normal pregnancy, unspecified, unspecified trimester: Secondary | ICD-10-CM | POA: Insufficient documentation

## 2015-06-12 DIAGNOSIS — Z331 Pregnant state, incidental: Secondary | ICD-10-CM

## 2015-06-12 LAB — POCT URINALYSIS DIPSTICK
Glucose, UA: NEGATIVE
KETONES UA: NEGATIVE
Leukocytes, UA: NEGATIVE
Nitrite, UA: NEGATIVE
PROTEIN UA: NEGATIVE
RBC UA: NEGATIVE

## 2015-06-12 NOTE — Telephone Encounter (Signed)
rx was sent to pharmacy

## 2015-06-12 NOTE — Patient Instructions (Signed)

## 2015-06-12 NOTE — Progress Notes (Signed)
Subjective:  Sonya Padilla is a 19 y.o. G37P1001 Caucasian female at [redacted]w[redacted]d by 6wk u/s, being seen today for her first obstetrical visit.  Her obstetrical history is significant for term uncomplicated svb x 1 [id number].  H/o major depressive disorder w/ suicidal ideations in past,  PTSD-on no meds currently- denies any problems w/ depression or suicidal ideations currently. Pregnancy history fully reviewed. Reports FOB born w/ heart defect- unsure of what type- with further questioning sounds like possible bicuspid aortic valve- not followed by cardiology.   Patient reports n/v- has zofran that was previously rx'd by [facility]. Denies vb, cramping, uti s/s, abnormal/malodorous vag d/c, or vulvovaginal itching/irritation.  BP 122/64 mmHg  Pulse 72  Ht 5\' 5"  (1.651 m)  Wt 242 lb (109.77 kg)  BMI 40.27 kg/m2  LMP 04/09/2015 (Exact Date)  HISTORY: OB History  Gravida Para Term Preterm AB SAB TAB Ectopic Multiple Living  2 1 1       1     # Outcome Date GA Lbr Len/2nd Weight Sex Delivery Anes PTL Lv  2 Current           1 Term 04/17/14 [redacted]w[redacted]d  6 lb 15 oz (3.147 kg) F Vag-Spont EPI N Y     Past Medical History  Diagnosis Date  . Obesity   . Broken leg     left  . Depression   . Suicide ([place])   . Amenorrhea 10/02/2014  . RLQ abdominal pain 10/02/2014  . Nausea 03/13/2015  . Urinary frequency 03/13/2015  . Irregular periods 05/13/2015  . Pelvic pain in female 05/13/2015  . Pregnant 05/13/2015   Past Surgical History  Procedure Laterality Date  . No past surgeries      Current as of 11/28/14  . Esophagogastroduodenoscopy N/A 12/12/2014    [id number] reflux    Family History  Problem Relation Age of Onset  . Hypertension Mother   . Stroke Paternal Aunt   . Diabetes Maternal Grandmother   . Heart disease Maternal Grandmother   . Hyperlipidemia Maternal Grandmother   . Drug abuse Maternal Grandmother   . Glaucoma Maternal Grandmother   . Cataracts Maternal Grandmother   .  Cancer Paternal Grandfather   . Psoriasis Sister   . Cervical cancer Maternal Aunt   . Physical abuse Maternal Uncle   . Prostate cancer Maternal Grandfather   . Alcohol abuse Maternal Grandfather   . Obesity Other   . Bipolar disorder Other     paternal aunt and cousin  . Drug abuse Other   . Other Father     heart issues  . Heart attack Father   . Colon cancer Paternal Grandfather     Exam   System:     General: Well developed & nourished, no acute distress   Skin: Warm & dry, normal coloration and turgor, no rashes   Neurologic: Alert & oriented, normal mood   Cardiovascular: Regular rate & rhythm   Respiratory: Effort & rate normal, LCTAB, acyanotic   Abdomen: Soft, non tender   Extremities: normal strength, tone   Thin prep pap smear <21yo    Assessment:   Pregnancy: G2P1001 Patient Active Problem List   Diagnosis Date Noted  . Supervision of normal pregnancy 06/12/2015    Priority: High  . Rh negative state in antepartum period 10/25/2013    Priority: High  . Obesity (BMI 30-39.9) 10/24/2013    Priority: High  . Smoker 10/24/2013    Priority: High  . Marijuana  use 10/24/2013    Priority: High  . Major depression w/ hx of suicidal thoughts 08/06/2011    Priority: High  . Post traumatic stress disorder 08/06/2011    Priority: High  . Irregular periods 05/13/2015  . Pelvic pain in female 05/13/2015  . Pregnant 05/13/2015  . Vaginal discharge 04/30/2015  . Nausea 03/13/2015  . Urinary frequency 03/13/2015  . Ulcerative esophagitis 01/30/2015  . Abdominal pain 01/30/2015  . Gastroesophageal reflux disease with esophagitis   . Abdominal pain, epigastric 11/28/2014  . Bowel habit changes 11/28/2014  . Nausea without vomiting 11/28/2014  . Amenorrhea 10/02/2014  . RLQ abdominal pain 10/02/2014  . Migraines w/ aura 07/18/2014    [redacted]w[redacted]d G2P1001 New OB visit N/V H/O PTSD, depression, suicidal ideations- denies any presently, no meds  Plan:  Initial  labs drawn Continue prenatal vitamins Problem list reviewed and updated Reviewed n/v relief measures and warning s/s to report Reviewed recommended weight gain based on pre-gravid BMI Encouraged well-balanced diet Genetic Screening discussed Integrated Screen: requested Cystic fibrosis screening discussed results reviewed, neg last preg Ultrasound discussed; fetal survey: requested Follow up in 4 weeks for 1st it/nt and visit [place] completed Will get fetal echo around 24-28wks d/t fob having heart defect  [name] CNM, [facility] 06/12/2015 3:36 PM

## 2015-06-13 LAB — PMP SCREEN PROFILE (10S), URINE
Amphetamine Screen, Ur: NEGATIVE ng/mL
BENZODIAZEPINE SCREEN, URINE: NEGATIVE ng/mL
Barbiturate Screen, Ur: NEGATIVE ng/mL
CANNABINOIDS UR QL SCN: NEGATIVE ng/mL
COCAINE(METAB.) SCREEN, URINE: NEGATIVE ng/mL
Creatinine(Crt), U: 106.8 mg/dL (ref 20.0–300.0)
Methadone Scn, Ur: NEGATIVE ng/mL
OXYCODONE+OXYMORPHONE UR QL SCN: NEGATIVE ng/mL
Opiate Scrn, Ur: NEGATIVE ng/mL
PCP Scrn, Ur: NEGATIVE ng/mL
PH UR, DRUG SCRN: 5.7 (ref 4.5–8.9)
Propoxyphene, Screen: NEGATIVE ng/mL

## 2015-06-13 LAB — CBC
HEMOGLOBIN: 12.6 g/dL (ref 11.1–15.9)
Hematocrit: 37.5 % (ref 34.0–46.6)
MCH: 29.9 pg (ref 26.6–33.0)
MCHC: 33.6 g/dL (ref 31.5–35.7)
MCV: 89 fL (ref 79–97)
Platelets: 216 10*3/uL (ref 150–379)
RBC: 4.22 x10E6/uL (ref 3.77–5.28)
RDW: 14 % (ref 12.3–15.4)
WBC: 9.1 10*3/uL (ref 3.4–10.8)

## 2015-06-13 LAB — GC/CHLAMYDIA PROBE AMP
CHLAMYDIA, DNA PROBE: NEGATIVE
Neisseria gonorrhoeae by PCR: NEGATIVE

## 2015-06-13 LAB — URINALYSIS, ROUTINE W REFLEX MICROSCOPIC
BILIRUBIN UA: NEGATIVE
Glucose, UA: NEGATIVE
Ketones, UA: NEGATIVE
Nitrite, UA: NEGATIVE
PH UA: 6.5 (ref 5.0–7.5)
Protein, UA: NEGATIVE
RBC UA: NEGATIVE
Specific Gravity, UA: 1.018 (ref 1.005–1.030)
Urobilinogen, Ur: 0.2 mg/dL (ref 0.2–1.0)

## 2015-06-13 LAB — MICROSCOPIC EXAMINATION: Casts: NONE SEEN /lpf

## 2015-06-13 LAB — ANTIBODY SCREEN: ANTIBODY SCREEN: NEGATIVE

## 2015-06-13 LAB — VARICELLA ZOSTER ANTIBODY, IGG: Varicella zoster IgG: 2930 index (ref >165)

## 2015-06-13 LAB — RUBELLA SCREEN: RUBELLA: 4.58 {index} (ref >0.99)

## 2015-06-13 LAB — HEPATITIS B SURFACE ANTIGEN: HEP B S AG: NEGATIVE

## 2015-06-13 LAB — ABO/RH: RH TYPE: NEGATIVE

## 2015-06-13 LAB — RPR: RPR: NONREACTIVE

## 2015-06-13 LAB — HIV ANTIBODY (ROUTINE TESTING W REFLEX): HIV SCREEN 4TH GENERATION: NONREACTIVE

## 2015-06-14 LAB — URINE CULTURE

## 2015-06-18 ENCOUNTER — Telehealth: Payer: Self-pay | Admitting: *Deleted

## 2015-06-18 NOTE — Telephone Encounter (Signed)
Pt c/o burning with urination. Pt requesting medication. Pt informed to push lots of water and cranberry juice, offered pt an appt today but cannot come. Appt scheduled for tomorrow for evaluation.

## 2015-06-19 ENCOUNTER — Telehealth: Payer: Self-pay | Admitting: Women's Health

## 2015-06-19 ENCOUNTER — Ambulatory Visit (INDEPENDENT_AMBULATORY_CARE_PROVIDER_SITE_OTHER): Payer: Medicaid Other | Admitting: Advanced Practice Midwife

## 2015-06-19 VITALS — BP 100/66 | HR 80 | Wt 240.0 lb

## 2015-06-19 DIAGNOSIS — Z1389 Encounter for screening for other disorder: Secondary | ICD-10-CM

## 2015-06-19 DIAGNOSIS — Z3491 Encounter for supervision of normal pregnancy, unspecified, first trimester: Secondary | ICD-10-CM

## 2015-06-19 DIAGNOSIS — Z3A09 9 weeks gestation of pregnancy: Secondary | ICD-10-CM

## 2015-06-19 DIAGNOSIS — Z331 Pregnant state, incidental: Secondary | ICD-10-CM

## 2015-06-19 DIAGNOSIS — R300 Dysuria: Secondary | ICD-10-CM

## 2015-06-19 LAB — POCT URINALYSIS DIPSTICK
Blood, UA: 1
GLUCOSE UA: NEGATIVE
Ketones, UA: NEGATIVE
NITRITE UA: NEGATIVE
Protein, UA: 2

## 2015-06-19 MED ORDER — CEPHALEXIN 500 MG PO CAPS: 500.0000 mg | ORAL_CAPSULE | Freq: Three times a day (TID) | ORAL | Status: DC

## 2015-06-19 NOTE — Progress Notes (Signed)
G2P1001 [redacted]w[redacted]d Estimated Date of Delivery: 01/21/16  Blood pressure 100/66, pulse 80, weight 240 lb (108.863 kg), last menstrual period 04/09/2015, not currently breastfeeding.   WORK IN FOR DYSURIA, LBP, PRESSURE WHEN VOIDING. No fever or CVAT.  BP weight and urine results all reviewed and noted.  Please refer to the obstetrical flow sheet for the fundal height and fetal heart rate documentation:  Patient denies any bleeding and no rupture of membranes symptoms or regular contractions. Patient is without complaints. All questions were answered.  Orders Placed This Encounter  Procedures  . Urine culture  . POCT urinalysis dipstick    Plan:  Continued routine obstetrical care, RX Keflex 500mg  TID X 7.  Pyelo warning given.  Culture urine  Return for As scheduled.

## 2015-06-19 NOTE — Patient Instructions (Signed)
If you get a fever greater than 100.4 (twice 30 minutes apart), let us know.  If it is after hours or the weekend, go to the hospital, as you will most likely need inpatient IV antibiotics.

## 2015-06-19 NOTE — Telephone Encounter (Signed)
Had message to call pt, that she was upset she was called by a nurse and not me and was rude to front office staff. Pt thinks she has uti and requests rx w/o being seen. Notified her she has to come give [place] a specimen so it can be cultured. Culture is no good after antibiotic started. Pt states she will keep appt today, reluctantly.  Roma Schanz, CNM, [facility] 06/19/2015 1:18 PM

## 2015-06-22 LAB — URINE CULTURE

## 2015-06-26 ENCOUNTER — Telehealth: Payer: Self-pay | Admitting: Adult Health

## 2015-06-26 ENCOUNTER — Telehealth: Payer: Self-pay | Admitting: Women's Health

## 2015-06-26 MED ORDER — DOXYLAMINE-PYRIDOXINE 10-10 MG PO TBEC: DELAYED_RELEASE_TABLET | ORAL | Status: DC

## 2015-06-26 MED ORDER — PROMETHAZINE HCL 25 MG PO TABS: 25.0000 mg | ORAL_TABLET | Freq: Four times a day (QID) | ORAL | Status: DC | PRN

## 2015-06-26 NOTE — Telephone Encounter (Signed)
Complains of nausea and vomiting and zofran not helping, can't afford diclegis, will rx phenergan

## 2015-06-26 NOTE — Telephone Encounter (Signed)
Pt called stating that the medication Shamyia has prescribed her is to expensive and she can't afford it. Pt would like another prescription called in that she could afford. Please contact pt

## 2015-06-26 NOTE — Telephone Encounter (Signed)
Pt called stating that she would like a phone call from [place], [place] did not state the reason why. Please contact pt

## 2015-06-26 NOTE — Telephone Encounter (Signed)
Pt states Zofran not helping would like a RX for Diclegis.

## 2015-06-26 NOTE — Telephone Encounter (Signed)
Will rx diclegis  

## 2015-06-26 NOTE — Telephone Encounter (Signed)
Informed pt had samples of Diclegis but pt states could not come to [place] to pick up.   Pt states Diclegis to expensive is there an alternative?

## 2015-06-26 NOTE — Telephone Encounter (Signed)
Duplicate message. 

## 2015-06-27 ENCOUNTER — Telehealth: Payer: Self-pay | Admitting: Adult Health

## 2015-06-27 NOTE — Telephone Encounter (Signed)
Spoke with pt. Pt states she talked to her caseworker and she has a new ID #. ID# is [id number] P. I spoke with [place] tried to put the new ID# in and it's still showing non active. Dawn to call pt. [place]

## 2015-07-01 ENCOUNTER — Telehealth: Payer: Self-pay | Admitting: Adult Health

## 2015-07-01 NOTE — Telephone Encounter (Signed)
Pt called stating that she would like a call back from [place], [place] did not state the reason why. Please contact pt

## 2015-07-01 NOTE — Telephone Encounter (Signed)
Left message x 1. JSY 

## 2015-07-01 NOTE — Telephone Encounter (Signed)
Spoke with pt letting her know Medicaid is still showing not active for December per [facility]. Pt needs to talk to case worker again. Call transferred to front desk to reschedule appt. [place]

## 2015-07-10 ENCOUNTER — Ambulatory Visit (INDEPENDENT_AMBULATORY_CARE_PROVIDER_SITE_OTHER): Payer: Medicaid Other

## 2015-07-10 ENCOUNTER — Ambulatory Visit (INDEPENDENT_AMBULATORY_CARE_PROVIDER_SITE_OTHER): Payer: Medicaid Other | Admitting: Women's Health

## 2015-07-10 ENCOUNTER — Encounter: Payer: Self-pay | Admitting: Women's Health

## 2015-07-10 VITALS — BP 104/60 | HR 84 | Wt 235.0 lb

## 2015-07-10 DIAGNOSIS — Z331 Pregnant state, incidental: Secondary | ICD-10-CM

## 2015-07-10 DIAGNOSIS — Z3A12 12 weeks gestation of pregnancy: Secondary | ICD-10-CM | POA: Diagnosis not present

## 2015-07-10 DIAGNOSIS — Z3682 Encounter for antenatal screening for nuchal translucency: Secondary | ICD-10-CM

## 2015-07-10 DIAGNOSIS — Z3481 Encounter for supervision of other normal pregnancy, first trimester: Secondary | ICD-10-CM

## 2015-07-10 DIAGNOSIS — Z1389 Encounter for screening for other disorder: Secondary | ICD-10-CM

## 2015-07-10 DIAGNOSIS — Z3491 Encounter for supervision of normal pregnancy, unspecified, first trimester: Secondary | ICD-10-CM

## 2015-07-10 DIAGNOSIS — Z3490 Encounter for supervision of normal pregnancy, unspecified, unspecified trimester: Secondary | ICD-10-CM

## 2015-07-10 DIAGNOSIS — Z36 Encounter for antenatal screening of mother: Secondary | ICD-10-CM

## 2015-07-10 LAB — POCT URINALYSIS DIPSTICK
Blood, UA: NEGATIVE
Glucose, UA: NEGATIVE
Ketones, UA: NEGATIVE
LEUKOCYTES UA: NEGATIVE
Nitrite, UA: NEGATIVE

## 2015-07-10 NOTE — Patient Instructions (Signed)

## 2015-07-10 NOTE — Progress Notes (Signed)
[place] 12+1wks,measurements c/w dates,normal ov's bilat,NB present,NT 1.69mm,ant pl gr 0,crl 56.29mm

## 2015-07-10 NOTE — Progress Notes (Signed)
Low-risk OB appointment G2P1001 [redacted]w[redacted]d Estimated Date of Delivery: 01/21/16 BP 104/60 mmHg  Pulse 84  Wt 235 lb (106.595 kg)  LMP 04/09/2015 (Exact Date)  BP, weight, and urine reviewed.  Refer to obstetrical flow sheet for FH & FHR.  No fm yet. Denies cramping, lof, vb, or uti s/s. Some cramping, n/v- phenergan helps.  Reviewed today's normal nt u/s, reasons to seek care. Plan:  Continue routine obstetrical care  F/U in 4wks for OB appointment and 2nd IT 1st IT/NT today

## 2015-07-12 LAB — MATERNAL SCREEN, INTEGRATED #1
Crown Rump Length: 56.8 mm
GEST. AGE ON COLLECTION DATE: 12.3 wk
MATERNAL AGE AT EDD: 19.6 a
NUMBER OF FETUSES: 1
Nuchal Translucency (NT): 1.4 mm
PAPP-A VALUE: 537.3 ng/mL
WEIGHT: 235 [lb_av]

## 2015-07-14 NOTE — L&D Delivery Note (Signed)
Delivery Note At 5:34 AM a viable female was delivered via Vaginal, Spontaneous Delivery (Presentation: Right Occiput Anterior).  APGAR: 8, 9; weight 9 lb 8 oz (4310 g).   Placenta status: Intact, Spontaneous.  Cord: 3 vessels with the following complications: None.    Anesthesia: Epidural  Episiotomy: None Lacerations: Right Periurethral Suture Repair: 3.0 monocryl Est. Blood Loss (mL): 750  Pt had a PPH of 750 cc managed by Dr Elonda Husky, requiring Hemabate, Methergine, and cytotec. CBC to be drawn in AM.  Mom to postpartum.  Baby to Couplet care / Skin to Skin.  Serita Grammes 01/23/2016, 5:59 AM

## 2015-08-07 ENCOUNTER — Encounter: Payer: Self-pay | Admitting: Women's Health

## 2015-08-07 ENCOUNTER — Ambulatory Visit (INDEPENDENT_AMBULATORY_CARE_PROVIDER_SITE_OTHER): Payer: Medicaid Other | Admitting: Women's Health

## 2015-08-07 VITALS — BP 124/62 | HR 84 | Wt 238.0 lb

## 2015-08-07 DIAGNOSIS — Z3682 Encounter for antenatal screening for nuchal translucency: Secondary | ICD-10-CM

## 2015-08-07 DIAGNOSIS — Z363 Encounter for antenatal screening for malformations: Secondary | ICD-10-CM

## 2015-08-07 DIAGNOSIS — Z3492 Encounter for supervision of normal pregnancy, unspecified, second trimester: Secondary | ICD-10-CM

## 2015-08-07 DIAGNOSIS — Z1389 Encounter for screening for other disorder: Secondary | ICD-10-CM

## 2015-08-07 DIAGNOSIS — Z331 Pregnant state, incidental: Secondary | ICD-10-CM

## 2015-08-07 LAB — POCT URINALYSIS DIPSTICK
Blood, UA: NEGATIVE
Glucose, UA: NEGATIVE
KETONES UA: NEGATIVE
Leukocytes, UA: NEGATIVE
Nitrite, UA: NEGATIVE

## 2015-08-07 NOTE — Progress Notes (Signed)
Low-risk OB appointment G2P1001 [redacted]w[redacted]d Estimated Date of Delivery: 01/21/16 BP 124/62 mmHg  Pulse 84  Wt 238 lb (107.956 kg)  LMP 04/09/2015 (Exact Date)  BP, weight, and urine reviewed.  Refer to obstetrical flow sheet for FH & FHR.  No fm yet. Denies cramping, lof, vb, or uti s/s. Some dizziness, only eating 2x/d and eating a lot of carbs- to eat frequent snacks w/ protein and change positions slowly. Some numbness Rt lower leg when sitting- normal color, +tibial/pedal pulses.  Reviewed warning s/s to report. Plan:  Continue routine obstetrical care  F/U in 4wks for OB appointment and anatomy u/s 2nd IT today

## 2015-08-07 NOTE — Patient Instructions (Signed)
For Dizzy Spells:   This is usually related to either your blood sugar or your blood pressure dropping  Make sure you are staying well hydrated and drinking enough water so that your urine is clear  Eat small frequent meals and snacks containing protein (meat, eggs, nuts, cheese) so that your blood sugar doesn't drop  If you do get dizzy, sit/lay down and get you something to drink and a snack containing protein- you will usually start feeling better in 10-20 minutes   Second Trimester of Pregnancy The second trimester is from week 13 through week 28, months 4 through 6. The second trimester is often a time when you feel your best. Your body has also adjusted to being pregnant, and you begin to feel better physically. Usually, morning sickness has lessened or quit completely, you may have more energy, and you may have an increase in appetite. The second trimester is also a time when the fetus is growing rapidly. At the end of the sixth month, the fetus is about 9 inches long and weighs about 1 pounds. You will likely begin to feel the baby move (quickening) between 18 and 20 weeks of the pregnancy. BODY CHANGES Your body goes through many changes during pregnancy. The changes vary from woman to woman.   Your weight will continue to increase. You will notice your lower abdomen bulging out.  You may begin to get stretch marks on your hips, abdomen, and breasts.  You may develop headaches that can be relieved by medicines approved by your health care provider.  You may urinate more often because the fetus is pressing on your bladder.  You may develop or continue to have heartburn as a result of your pregnancy.  You may develop constipation because certain hormones are causing the muscles that push waste through your intestines to slow down.  You may develop hemorrhoids or swollen, bulging veins (varicose veins).  You may have back pain because of the weight gain and pregnancy hormones  relaxing your joints between the bones in your pelvis and as a result of a shift in weight and the muscles that support your balance.  Your breasts will continue to grow and be tender.  Your gums may bleed and may be sensitive to brushing and flossing.  Dark spots or blotches (chloasma, mask of pregnancy) may develop on your face. This will likely fade after the baby is born.  A dark line from your belly button to the pubic area (linea nigra) may appear. This will likely fade after the baby is born.  You may have changes in your hair. These can include thickening of your hair, rapid growth, and changes in texture. Some women also have hair loss during or after pregnancy, or hair that feels dry or thin. Your hair will most likely return to normal after your baby is born. WHAT TO EXPECT AT YOUR PRENATAL VISITS During a routine prenatal visit:  You will be weighed to make sure you and the fetus are growing normally.  Your blood pressure will be taken.  Your abdomen will be measured to track your baby's growth.  The fetal heartbeat will be listened to.  Any test results from the previous visit will be discussed. Your health care provider may ask you:  How you are feeling.  If you are feeling the baby move.  If you have had any abnormal symptoms, such as leaking fluid, bleeding, severe headaches, or abdominal cramping.  If you are using any tobacco products,  including cigarettes, chewing tobacco, and electronic cigarettes.  If you have any questions. Other tests that may be performed during your second trimester include:  Blood tests that check for:  Low iron levels (anemia).  Gestational diabetes (between 24 and 28 weeks).  Rh antibodies.  Urine tests to check for infections, diabetes, or protein in the urine.  An ultrasound to confirm the proper growth and development of the baby.  An amniocentesis to check for possible genetic problems.  Fetal screens for spina bifida  and Down syndrome.  HIV (human immunodeficiency virus) testing. Routine prenatal testing includes screening for HIV, unless you choose not to have this test. HOME CARE INSTRUCTIONS   Avoid all smoking, herbs, alcohol, and unprescribed drugs. These chemicals affect the formation and growth of the baby.  Do not use any tobacco products, including cigarettes, chewing tobacco, and electronic cigarettes. If you need help quitting, ask your health care provider. You may receive counseling support and other resources to help you quit.  Follow your health care provider's instructions regarding medicine use. There are medicines that are either safe or unsafe to take during pregnancy.  Exercise only as directed by your health care provider. Experiencing uterine cramps is a good sign to stop exercising.  Continue to eat regular, healthy meals.  Wear a good support bra for breast tenderness.  Do not use hot tubs, steam rooms, or saunas.  Wear your seat belt at all times when driving.  Avoid raw meat, uncooked cheese, cat litter boxes, and soil used by cats. These carry germs that can cause birth defects in the baby.  Take your prenatal vitamins.  Take 1500-2000 mg of calcium daily starting at the 20th week of pregnancy until you deliver your baby.  Try taking a stool softener (if your health care provider approves) if you develop constipation. Eat more high-fiber foods, such as fresh vegetables or fruit and whole grains. Drink plenty of fluids to keep your urine clear or pale yellow.  Take warm sitz baths to soothe any pain or discomfort caused by hemorrhoids. Use hemorrhoid cream if your health care provider approves.  If you develop varicose veins, wear support hose. Elevate your feet for 15 minutes, 3-4 times a day. Limit salt in your diet.  Avoid heavy lifting, wear low heel shoes, and practice good posture.  Rest with your legs elevated if you have leg cramps or low back pain.  Visit  your dentist if you have not gone yet during your pregnancy. Use a soft toothbrush to brush your teeth and be gentle when you floss.  A sexual relationship may be continued unless your health care provider directs you otherwise.  Continue to go to all your prenatal visits as directed by your health care provider. SEEK MEDICAL CARE IF:   You have dizziness.  You have mild pelvic cramps, pelvic pressure, or nagging pain in the abdominal area.  You have persistent nausea, vomiting, or diarrhea.  You have a bad smelling vaginal discharge.  You have pain with urination. SEEK IMMEDIATE MEDICAL CARE IF:   You have a fever.  You are leaking fluid from your vagina.  You have spotting or bleeding from your vagina.  You have severe abdominal cramping or pain.  You have rapid weight gain or loss.  You have shortness of breath with chest pain.  You notice sudden or extreme swelling of your face, hands, ankles, feet, or legs.  You have not felt your baby move in over an hour.  You have severe headaches that do not go away with medicine.  You have vision changes.   This information is not intended to replace advice given to you by your health care provider. Make sure you discuss any questions you have with your health care provider.   Document Released: 06/23/2001 Document Revised: 07/20/2014 Document Reviewed: 08/30/2012 Elsevier Interactive Patient Education [employer].

## 2015-08-09 LAB — MATERNAL SCREEN, INTEGRATED #2
AFP MOM: 1.27
Alpha-Fetoprotein: 27.3 ng/mL
Crown Rump Length: 56.8 mm
DIA MoM: 1.74
DIA Value: 232.8 pg/mL
Estriol, Unconjugated: 0.77 ng/mL
GEST. AGE ON COLLECTION DATE: 12.3 wk
Gestational Age: 16.3 weeks
HCG MOM: 1.98
HCG VALUE: 46.9 [IU]/mL
MATERNAL AGE AT EDD: 19.6 a
Nuchal Translucency (NT): 1.4 mm
Nuchal Translucency MoM: 0.95
Number of Fetuses: 1
PAPP-A MOM: 1.05
PAPP-A VALUE: 537.3 ng/mL
Test Results:: NEGATIVE
WEIGHT: 235 [lb_av]
WEIGHT: 238 [lb_av]
uE3 MoM: 0.98

## 2015-09-04 ENCOUNTER — Ambulatory Visit (INDEPENDENT_AMBULATORY_CARE_PROVIDER_SITE_OTHER): Payer: Medicaid Other | Admitting: Advanced Practice Midwife

## 2015-09-04 ENCOUNTER — Ambulatory Visit (INDEPENDENT_AMBULATORY_CARE_PROVIDER_SITE_OTHER): Payer: Medicaid Other

## 2015-09-04 ENCOUNTER — Encounter: Payer: Self-pay | Admitting: Advanced Practice Midwife

## 2015-09-04 VITALS — BP 120/70 | HR 84 | Wt 239.0 lb

## 2015-09-04 DIAGNOSIS — Z363 Encounter for antenatal screening for malformations: Secondary | ICD-10-CM

## 2015-09-04 DIAGNOSIS — Z3A21 21 weeks gestation of pregnancy: Secondary | ICD-10-CM

## 2015-09-04 DIAGNOSIS — Z36 Encounter for antenatal screening of mother: Secondary | ICD-10-CM

## 2015-09-04 DIAGNOSIS — Z3492 Encounter for supervision of normal pregnancy, unspecified, second trimester: Secondary | ICD-10-CM

## 2015-09-04 DIAGNOSIS — Z1389 Encounter for screening for other disorder: Secondary | ICD-10-CM

## 2015-09-04 DIAGNOSIS — O321XX1 Maternal care for breech presentation, fetus 1: Secondary | ICD-10-CM

## 2015-09-04 DIAGNOSIS — Z331 Pregnant state, incidental: Secondary | ICD-10-CM

## 2015-09-04 LAB — POCT URINALYSIS DIPSTICK
Glucose, UA: NEGATIVE
KETONES UA: NEGATIVE
Leukocytes, UA: NEGATIVE
Nitrite, UA: NEGATIVE
RBC UA: NEGATIVE

## 2015-09-04 NOTE — Progress Notes (Signed)
[place] 20+1 wks,measurements c/w dates, efw 359 g ,normal ov's bilat,svp of fluid 6.2 cm,ant pl gr 0,breech,fhr 142 bpm,anatomy complete,no obvious abn seen

## 2015-09-04 NOTE — Progress Notes (Signed)
[id number] [redacted]w[redacted]d Estimated Date of Delivery: 01/21/16  Last menstrual period 04/09/2015, not currently breastfeeding.   BP weight and urine results all reviewed and noted.  Please refer to the obstetrical flow sheet for the fundal height and fetal heart rate documentation: [place] 20+1 wks,measurements c/w dates, efw 359 g ,normal ov's bilat,svp of fluid 6.2 cm,ant pl gr 0,breech,fhr 142 bpm,anatomy complete,no obvious abn seen          Patient reports good fetal movement, denies any bleeding and no rupture of membranes symptoms or regular contractions. Patient is without complaints. All questions were answered.  No orders of the defined types were placed in this encounter.    Plan:  Continued routine obstetrical care,   Return in about 4 weeks (around 10/02/2015) for [place].

## 2015-09-19 ENCOUNTER — Telehealth: Payer: Self-pay | Admitting: Family Medicine

## 2015-09-19 ENCOUNTER — Telehealth: Payer: Self-pay | Admitting: *Deleted

## 2015-09-19 NOTE — Telephone Encounter (Signed)
Patient aware and understanding

## 2015-09-19 NOTE — Telephone Encounter (Signed)
Tell her to use Flonase and Mucinex and Claritin. If worsens come in for a visit and can be seen for the mom. Caryl Pina, MD [facility] 09/19/2015, 12:59 PM

## 2015-09-19 NOTE — Telephone Encounter (Signed)
1 mo old daughter Sonya Padilla was seen yesterday for viral illness & OM. TC from mom that she has runny nose & cough

## 2015-09-19 NOTE — Telephone Encounter (Signed)
Spoke with Sonya Padilla. Sonya Padilla was advised to try Zyrtec, Claritin, or Benadryl instead of Mucinex per JAG. Also advised to use Sea Mist instead of Flonase per JAG. Sonya Padilla was advised to let us know if this didn't help or if she got worse. Sonya Padilla voiced understanding. [place]

## 2015-09-26 ENCOUNTER — Telehealth: Payer: Self-pay | Admitting: Advanced Practice Midwife

## 2015-09-26 NOTE — Telephone Encounter (Signed)
Sounds like round ligament.  LLQ pain, stabbing, hurts worse when moving.  US shows no cyst.  Pt cautioned against feverr, N/V.

## 2015-10-02 ENCOUNTER — Ambulatory Visit (INDEPENDENT_AMBULATORY_CARE_PROVIDER_SITE_OTHER): Payer: Medicaid Other | Admitting: Obstetrics and Gynecology

## 2015-10-02 ENCOUNTER — Encounter: Payer: Self-pay | Admitting: Obstetrics and Gynecology

## 2015-10-02 VITALS — BP 100/60 | HR 92 | Wt 241.5 lb

## 2015-10-02 DIAGNOSIS — Z1389 Encounter for screening for other disorder: Secondary | ICD-10-CM

## 2015-10-02 DIAGNOSIS — Z3492 Encounter for supervision of normal pregnancy, unspecified, second trimester: Secondary | ICD-10-CM

## 2015-10-02 DIAGNOSIS — Z331 Pregnant state, incidental: Secondary | ICD-10-CM

## 2015-10-02 LAB — POCT URINALYSIS DIPSTICK
Blood, UA: NEGATIVE
Glucose, UA: NEGATIVE
Ketones, UA: NEGATIVE
LEUKOCYTES UA: NEGATIVE
Nitrite, UA: NEGATIVE

## 2015-10-02 NOTE — Patient Instructions (Signed)
(  [phone] is the phone number for Pregnancy Classes , lactation classes or hospital tours at [facility] will be referred to  [email or web address] for more information on childbirth classes  At this site you may register for classes. You may sign up for a waiting list if classes are full. Please SIGN UP FOR THIS!.   When the waiting list becomes long, sometimes new classes can be added.

## 2015-10-02 NOTE — Progress Notes (Signed)
Pt denies any problems or concerns at this time.  

## 2015-10-02 NOTE — Progress Notes (Signed)
Patient ID: Sonya Padilla, female   DOB: [birth year], 20 y.o.   MRN: [id number] G2P1001 [redacted]w[redacted]d Estimated Date of Delivery: 01/21/16  Blood pressure 100/60, pulse 92, weight 241 lb 8 oz (109.544 kg), last menstrual period 04/09/2015, not currently breastfeeding.   refer to the ob flow sheet for FH and FHR, also BP, Wt, Urine results: negative  FHR: 136 FH: 26cm Patient reports + good fetal movement, denies any bleeding and no rupture of membranes symptoms or regular contractions. Patient complaints: Pt denies any complaints at this time. Pt reports she intends to try breastfeeding, noting that with her fist child she bottle fed.   Questions were answered. Assessment: LROB G2P1001 @ [redacted]w[redacted]d   Plan:   1. Continued routine obstetrical care 2. F/u in 4 weeks for routine OB care.  3. Lactation classes.    By signing my name below, I, Terressa Koyanagi, attest that this documentation has been prepared under the direction and in the presence of Mallory Shirk, MD. Electronically Signed: Terressa Koyanagi, ED Scribe. 10/02/2015. 4:11 PM.   I personally performed the services described in this documentation, which was SCRIBED in my presence. The recorded information has been reviewed and considered accurate. It has been edited as necessary during review. Jonnie Kind, MD

## 2015-10-07 ENCOUNTER — Telehealth: Payer: Self-pay | Admitting: *Deleted

## 2015-10-07 NOTE — Telephone Encounter (Signed)
No its 1 baby active baby maybe but just 1

## 2015-10-07 NOTE — Telephone Encounter (Signed)
Pt informed per Dr. Elonda Husky, one baby, possible very active baby but only one baby. Pt verbalized understanding.

## 2015-10-07 NOTE — Telephone Encounter (Signed)
Pt states she thinks she is pregnant with twins verses single pregnancy due to feeling kicks at upper abdomen and lower abdomen at the same time. Pt advised at her last ultrasound on 09/04/2015 20 wk 1 day only saw a single pregnancy and likely hood of twin pregnancy not being identified at that time would be unlikely. Pt requesting message to be sent to a provider for further discussion.

## 2015-10-23 ENCOUNTER — Telehealth: Payer: Self-pay | Admitting: Women's Health

## 2015-10-23 NOTE — Telephone Encounter (Signed)
Returned pt's phone call. Pt wants to have u/s at next visit to 'check amniotic fluid' since she was induced last pregnancy for oligohydramnios. Reviewed pt's pp visit notes- delivered at [facility]- went in for r/o SROM was deemed to not be ruptured but had AFI of 3.9 so was induced. Discussed w/ JVF- can do AFI in Room 2 at her next appt.  Roma Schanz, CNM, [facility] 10/23/2015 12:14 PM

## 2015-10-30 ENCOUNTER — Encounter: Payer: Self-pay | Admitting: Obstetrics and Gynecology

## 2015-10-30 ENCOUNTER — Ambulatory Visit (INDEPENDENT_AMBULATORY_CARE_PROVIDER_SITE_OTHER): Payer: Medicaid Other | Admitting: Nurse Practitioner

## 2015-10-30 ENCOUNTER — Encounter: Payer: Self-pay | Admitting: Nurse Practitioner

## 2015-10-30 ENCOUNTER — Ambulatory Visit (INDEPENDENT_AMBULATORY_CARE_PROVIDER_SITE_OTHER): Payer: Medicaid Other | Admitting: Obstetrics and Gynecology

## 2015-10-30 ENCOUNTER — Ambulatory Visit: Payer: Medicaid Other | Admitting: Nurse Practitioner

## 2015-10-30 ENCOUNTER — Encounter: Payer: Medicaid Other | Admitting: Women's Health

## 2015-10-30 ENCOUNTER — Other Ambulatory Visit: Payer: Medicaid Other

## 2015-10-30 VITALS — BP 113/73 | HR 101 | Temp 98.0°F | Ht 65.0 in | Wt 247.6 lb

## 2015-10-30 VITALS — BP 118/60 | HR 93 | Wt 248.5 lb

## 2015-10-30 DIAGNOSIS — O99323 Drug use complicating pregnancy, third trimester: Secondary | ICD-10-CM

## 2015-10-30 DIAGNOSIS — K5900 Constipation, unspecified: Secondary | ICD-10-CM

## 2015-10-30 DIAGNOSIS — Z3492 Encounter for supervision of normal pregnancy, unspecified, second trimester: Secondary | ICD-10-CM | POA: Diagnosis not present

## 2015-10-30 DIAGNOSIS — K2100 Gastro-esophageal reflux disease with esophagitis, without bleeding: Secondary | ICD-10-CM

## 2015-10-30 DIAGNOSIS — Z1389 Encounter for screening for other disorder: Secondary | ICD-10-CM | POA: Diagnosis not present

## 2015-10-30 DIAGNOSIS — Z331 Pregnant state, incidental: Secondary | ICD-10-CM | POA: Diagnosis not present

## 2015-10-30 DIAGNOSIS — Z369 Encounter for antenatal screening, unspecified: Secondary | ICD-10-CM

## 2015-10-30 DIAGNOSIS — Z131 Encounter for screening for diabetes mellitus: Secondary | ICD-10-CM

## 2015-10-30 DIAGNOSIS — O09292 Supervision of pregnancy with other poor reproductive or obstetric history, second trimester: Secondary | ICD-10-CM | POA: Diagnosis not present

## 2015-10-30 LAB — POCT URINALYSIS DIPSTICK
Blood, UA: NEGATIVE
Glucose, UA: NEGATIVE
Ketones, UA: NEGATIVE
Leukocytes, UA: NEGATIVE
NITRITE UA: NEGATIVE

## 2015-10-30 NOTE — Progress Notes (Signed)
Referring Provider: Chevis Pretty, * Primary Care Physician:  Kenn File, MD Primary GI:  Dr. Gala Romney  Chief Complaint  Patient presents with  . Follow-up    Doing well    HPI:   Sonya Padilla is a 20 y.o. female who presents For follow-up on GERD and erosive esophagitis. She was last seen in our office 05/01/2015 at which point she stated she was doing better and symptoms essentially resolved on Dexilant with occasional breakthrough symptoms when she eats things that she knows will cause symptoms. Recommended follow-up in 6 months for long-term symptom evaluation.  Today she states she has confirmed she is pregnant, is following OB for pregnancy monitoring. Most medications have been discontinued as is typical. She is now off [place]. GERD symptoms not as well controlled. Has symptoms is she eats greasy foods at which point she will have symptoms when laying down to go to sleep. Typically eats dinner 4-5 hours before bed, but does have midnight snacks. Some minimal pregnancy-related abdominal discomfort. No N/V. Has a bowel movement about every 5 days, benefiber twice daily has not helped. Drinks about 6 bottles of water a day, minimal fiber intake. Denies chest pain, dyspnea, dizziness, lightheadedness, syncope, near syncope. Denies any other upper or lower GI symptoms.  Past Medical History  Diagnosis Date  . Obesity   . Broken leg     left  . Depression   . Suicide ([place])   . Amenorrhea 10/02/2014  . RLQ abdominal pain 10/02/2014  . Nausea 03/13/2015  . Urinary frequency 03/13/2015  . Irregular periods 05/13/2015  . Pelvic pain in female 05/13/2015  . Pregnant 05/13/2015    Past Surgical History  Procedure Laterality Date  . No past surgeries      Current as of 11/28/14  . Esophagogastroduodenoscopy N/A 12/12/2014    [id number] reflux     Current Outpatient Prescriptions  Medication Sig Dispense Refill  . albuterol (PROAIR HFA) 108 (90 BASE) MCG/ACT  inhaler Inhale into the lungs every 6 (six) hours as needed for wheezing or shortness of breath. Reported on 09/04/2015    . Prenat w/o A-[id number]-[id number]-FA-DHA (CITRANATAL 90 DHA) 90-1 & 300 MG MISC Take 1 bid 60 each 11   No current facility-administered medications for this visit.    Allergies as of 10/30/2015 - Review Complete 10/30/2015  Allergen Reaction Noted  . Tylenol [acetaminophen] Other (See Comments) 09/14/2013    Family History  Problem Relation Age of Onset  . Hypertension Mother   . Stroke Paternal Aunt   . Diabetes Maternal Grandmother   . Heart disease Maternal Grandmother   . Hyperlipidemia Maternal Grandmother   . Drug abuse Maternal Grandmother   . Glaucoma Maternal Grandmother   . Cataracts Maternal Grandmother   . Cancer Paternal Grandfather   . Psoriasis Sister   . Cervical cancer Maternal Aunt   . Physical abuse Maternal Uncle   . Prostate cancer Maternal Grandfather   . Alcohol abuse Maternal Grandfather   . Obesity Other   . Bipolar disorder Other     paternal aunt and cousin  . Drug abuse Other   . Other Father     heart issues  . Heart attack Father   . Colon cancer Paternal Grandfather     Social History   Social History  . Marital Status: Single    Spouse Name: N/A  . Number of Children: N/A  . Years of Education: N/A   Occupational History  . 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   Social History Main Topics  . Smoking status: Former Smoker -- 1.00 packs/day for 2 years    Types: Cigarettes    Quit date: 11/27/2012  . Smokeless tobacco: Former Systems developer    Types: Chew     Comment: quit in 04/2015  . Alcohol Use: No  . Drug Use: No  . Sexual Activity:    Partners: Male    Birth Control/ Protection: None   Other Topics Concern  . None   Social History Narrative    Review of Systems: General: Negative for anorexia, weight loss, fever, chills, fatigue, weakness. ENT: Negative for hoarseness, difficulty swallowing CV: Negative for  chest pain, angina, palpitations, peripheral edema.  Respiratory: Negative for dyspnea at rest, cough, sputum, wheezing.  GI: See history of present illness. Endo: Negative for unusual weight change.    Physical Exam: BP 113/73 mmHg  Pulse 101  Temp(Src) 98 F (36.7 C) (Oral)  Ht 5\' 5"  (1.651 m)  Wt 247 lb 9.6 oz (112.311 kg)  BMI 41.20 kg/m2  LMP 04/09/2015 (Exact Date) General:   Alert and oriented. Pleasant and cooperative. Well-nourished and well-developed.  Head:  Normocephalic and atraumatic. Eyes:  Without icterus, sclera clear and conjunctiva pink.  Ears:  Normal auditory acuity. Cardiovascular:  S1, S2 present without murmurs appreciated. Extremities without clubbing or edema. Respiratory:  Clear to auscultation bilaterally. No wheezes, rales, or rhonchi. No distress.  Gastrointestinal:  +BS, rounded but soft, non-tender and non-distended. No guarding or rebound.  Rectal:  Deferred  Musculoskalatal:  Symmetrical without gross deformities. Skin:  Intact without significant lesions or rashes. Neurologic:  Alert and oriented x4;  grossly normal neurologically. Psych:  Alert and cooperative. Normal mood and affect. Heme/Lymph/Immune: No excessive bruising noted.    10/30/2015 2:54 PM   Disclaimer: This note was dictated with voice recognition software. Similar sounding words can inadvertently be transcribed and may not be corrected upon review.

## 2015-10-30 NOTE — Patient Instructions (Addendum)
1. Below is some information related to GERD and management of symptoms. 2. I send a message to your obstetrician to ask about options for constipation. 3. Return for follow-up in 6 months to evaluate for need for returning to [place] after you have the baby. 4. Best of luck with the pregnancy and delivery!     Food Choices for Gastroesophageal Reflux Disease, Adult When you have gastroesophageal reflux disease (GERD), the foods you eat and your eating habits are very important. Choosing the right foods can help ease the discomfort of GERD. WHAT GENERAL GUIDELINES DO I NEED TO FOLLOW?  Choose fruits, vegetables, whole grains, low-fat dairy products, and low-fat meat, fish, and poultry.  Limit fats such as oils, salad dressings, butter, nuts, and avocado.  Keep a food diary to identify foods that cause symptoms.  Avoid foods that cause reflux. These may be different for different people.  Eat frequent small meals instead of three large meals each day.  Eat your meals slowly, in a relaxed setting.  Limit fried foods.  Cook foods using methods other than frying.  Avoid drinking alcohol.  Avoid drinking large amounts of liquids with your meals.  Avoid bending over or lying down until 2-3 hours after eating. WHAT FOODS ARE NOT RECOMMENDED? The following are some foods and drinks that may worsen your symptoms: Vegetables Tomatoes. Tomato juice. Tomato and spaghetti sauce. Chili peppers. Onion and garlic. Horseradish. Fruits Oranges, grapefruit, and lemon (fruit and juice). Meats High-fat meats, fish, and poultry. This includes hot dogs, ribs, ham, sausage, salami, and bacon. Dairy Whole milk and chocolate milk. Sour cream. Cream. Butter. Ice cream. Cream cheese.  Beverages Coffee and tea, with or without caffeine. Carbonated beverages or energy drinks. Condiments Hot sauce. Barbecue sauce.  Sweets/Desserts Chocolate and cocoa. Donuts. Peppermint and spearmint. Fats and  Oils High-fat foods, including [place] fries and potato chips. Other Vinegar. Strong spices, such as black pepper, white pepper, red pepper, cayenne, curry powder, cloves, ginger, and chili powder. The items listed above may not be a complete list of foods and beverages to avoid. Contact your dietitian for more information.   This information is not intended to replace advice given to you by your health care provider. Make sure you discuss any questions you have with your health care provider.   Document Released: 06/29/2005 Document Revised: 07/20/2014 Document Reviewed: 05/03/2013 Elsevier Interactive Patient Education 2016 [place] [employer].    Gastroesophageal Reflux Disease, Adult Normally, food travels down the esophagus and stays in the stomach to be digested. However, when a person has gastroesophageal reflux disease (GERD), food and stomach acid move back up into the esophagus. When this happens, the esophagus becomes sore and inflamed. Over time, GERD can create small holes (ulcers) in the lining of the esophagus.  CAUSES This condition is caused by a problem with the muscle between the esophagus and the stomach (lower esophageal sphincter, or LES). Normally, the LES muscle closes after food passes through the esophagus to the stomach. When the LES is weakened or abnormal, it does not close properly, and that allows food and stomach acid to go back up into the esophagus. The LES can be weakened by certain dietary substances, medicines, and medical conditions, including:  Tobacco use.  Pregnancy.  Having a hiatal hernia.  Heavy alcohol use.  Certain foods and beverages, such as coffee, chocolate, onions, and peppermint. RISK FACTORS This condition is more likely to develop in:  People who have an increased body weight.  People who  have connective tissue disorders.  People who use NSAID medicines. SYMPTOMS Symptoms of this condition include:  Heartburn.  Difficult or  painful swallowing.  The feeling of having a lump in the throat.  Abitter taste in the mouth.  Bad breath.  Having a large amount of saliva.  Having an upset or bloated stomach.  Belching.  Chest pain.  Shortness of breath or wheezing.  Ongoing (chronic) cough or a night-time cough.  Wearing away of tooth enamel.  Weight loss. Different conditions can cause chest pain. Make sure to see your health care provider if you experience chest pain. DIAGNOSIS Your health care provider will take a medical history and perform a physical exam. To determine if you have mild or severe GERD, your health care provider may also monitor how you respond to treatment. You may also have other tests, including:  An endoscopy toexamine your stomach and esophagus with a small camera.  A test thatmeasures the acidity level in your esophagus.  A test thatmeasures how much pressure is on your esophagus.  A barium swallow or modified barium swallow to show the shape, size, and functioning of your esophagus. TREATMENT The goal of treatment is to help relieve your symptoms and to prevent complications. Treatment for this condition may vary depending on how severe your symptoms are. Your health care provider may recommend:  Changes to your diet.  Medicine.  Surgery. HOME CARE INSTRUCTIONS Diet  Follow a diet as recommended by your health care provider. This may involve avoiding foods and drinks such as:  Coffee and tea (with or without caffeine).  Drinks that containalcohol.  Energy drinks and sports drinks.  Carbonated drinks or sodas.  Chocolate and cocoa.  Peppermint and mint flavorings.  Garlic and onions.  Horseradish.  Spicy and acidic foods, including peppers, chili powder, curry powder, vinegar, hot sauces, and barbecue sauce.  Citrus fruit juices and citrus fruits, such as oranges, lemons, and limes.  Tomato-based foods, such as red sauce, chili, salsa, and pizza  with red sauce.  Fried and fatty foods, such as donuts, french fries, potato chips, and high-fat dressings.  High-fat meats, such as hot dogs and fatty cuts of red and white meats, such as rib eye steak, sausage, ham, and bacon.  High-fat dairy items, such as whole milk, butter, and cream cheese.  Eat small, frequent meals instead of large meals.  Avoid drinking large amounts of liquid with your meals.  Avoid eating meals during the 2-3 hours before bedtime.  Avoid lying down right after you eat.  Do not exercise right after you eat. General Instructions  Pay attention to any changes in your symptoms.  Take over-the-counter and prescription medicines only as told by your health care provider. Do not take aspirin, ibuprofen, or other NSAIDs unless your health care provider told you to do so.  Do not use any tobacco products, including cigarettes, chewing tobacco, and e-cigarettes. If you need help quitting, ask your health care provider.  Wear loose-fitting clothing. Do not wear anything tight around your waist that causes pressure on your abdomen.  Raise (elevate) the head of your bed 6 inches (15cm).  Try to reduce your stress, such as with yoga or meditation. If you need help reducing stress, ask your health care provider.  If you are overweight, reduce your weight to an amount that is healthy for you. Ask your health care provider for guidance about a safe weight loss goal.  Keep all follow-up visits as told by your  health care provider. This is important. SEEK MEDICAL CARE IF:  You have new symptoms.  You have unexplained weight loss.  You have difficulty swallowing, or it hurts to swallow.  You have wheezing or a persistent cough.  Your symptoms do not improve with treatment.  You have a hoarse voice. SEEK IMMEDIATE MEDICAL CARE IF:  You have pain in your arms, neck, jaw, teeth, or back.  You feel sweaty, dizzy, or light-headed.  You have chest pain or  shortness of breath.  You vomit and your vomit looks like blood or coffee grounds.  You faint.  Your stool is bloody or black.  You cannot swallow, drink, or eat.   This information is not intended to replace advice given to you by your health care provider. Make sure you discuss any questions you have with your health care provider.   Document Released: 04/08/2005 Document Revised: 03/20/2015 Document Reviewed: 10/24/2014 Elsevier Interactive Patient Education 2016 Elsevier Inc.    Heartburn During Pregnancy Heartburn is a burning sensation in the chest caused by stomach acid backing up into the esophagus. Heartburn is common in pregnancy because a certain hormone (progesterone) is released when a woman is pregnant. The progesterone hormone may relax the valve that separates the esophagus from the stomach. This allows acid to go up into the esophagus, causing heartburn. Heartburn may also happen in pregnancy because the enlarging uterus pushes up on the stomach, which pushes more acid into the esophagus. This is especially true in the later stages of pregnancy. Heartburn problems usually go away after giving birth. CAUSES  Heartburn is caused by stomach acid backing up into the esophagus. During pregnancy, this may result from various things, including:   The progesterone hormone.  Changing hormone levels.  The growing uterus pushing stomach acid upward.  Large meals.  Certain foods and drinks.  Exercise.  Increased acid production. SIGNS AND SYMPTOMS   Burning pain in the chest or lower throat.  Bitter taste in the mouth.  Coughing. DIAGNOSIS  Your health care provider will typically diagnose heartburn by taking a careful history of your concern. Blood tests may be done to check for a certain type of bacteria that is associated with heartburn. Sometimes, heartburn is diagnosed by prescribing a heartburn medicine to see if the symptoms improve. In some cases, a procedure  called an endoscopy may be done. In this procedure, a tube with a light and a camera on the end (endoscope) is used to examine the esophagus and the stomach. TREATMENT  Treatment will vary depending on the severity of your symptoms. Your health care provider may recommend:  Over-the-counter medicines (antacids, acid reducers) for mild heartburn.  Prescription medicines to decrease stomach acid or to protect your stomach lining.  Certain changes in your diet.  Elevating the head of your bed by putting blocks under the legs. This helps prevent stomach acid from backing up into the esophagus when you are lying down. HOME CARE INSTRUCTIONS   Only take over-the-counter or prescription medicines as directed by your health care provider.  Raise the head of your bed by putting blocks under the legs if instructed to do so by your health care provider. Sleeping with more pillows is not effective because it only changes the position of your head.  Do not exercise right after eating.  Avoid eating 2-3 hours before bed. Do not lie down right after eating.  Eat small meals throughout the day instead of three large meals.  Identify foods and  beverages that make your symptoms worse and avoid them. Foods you may want to avoid include:  Peppers.  Chocolate.  High-fat foods, including fried foods.  Spicy foods.  Garlic and onions.  Citrus fruits, including oranges, grapefruit, lemons, and limes.  Food containing tomatoes or tomato products.  Mint.  Carbonated and caffeinated drinks.  Vinegar. SEEK MEDICAL CARE IF:  You have abdominal pain of any kind.  You feel burning in your upper abdomen or chest, especially after eating or lying down.  You have nausea and vomiting.  Your stomach feels upset after you eat. SEEK IMMEDIATE MEDICAL CARE IF:   You have severe chest pain that goes down your arm or into your jaw or neck.  You feel sweaty, dizzy, or light-headed.  You become short  of breath.  You vomit blood.  You have difficulty or pain with swallowing.  You have bloody or black, tarry stools.  You have episodes of heartburn more than 3 times a week, for more than 2 weeks. MAKE SURE YOU:  Understand these instructions.  Will watch your condition.  Will get help right away if you are not doing well or get worse.   This information is not intended to replace advice given to you by your health care provider. Make sure you discuss any questions you have with your health care provider.   Document Released: 06/26/2000 Document Revised: 07/20/2014 Document Reviewed: 02/15/2013 Elsevier Interactive Patient Education [employer].

## 2015-10-30 NOTE — Assessment & Plan Note (Signed)
Patient with constipation having a bowel movement about every 5 days. She is asking about medications for constipation. She drinks adequate water, minimal fiber intake. However, she has tried Benefiber twice a day which has not helped. MiraLAX seems likely the most effective yet safe option in pregnancy although pregnancy risk category is not established. I send a message to her obstetrician to discuss options and the general safety of MiraLAX in pregnancy. We will notify her of recommendations once I hear back from the obstetrician.

## 2015-10-30 NOTE — Progress Notes (Signed)
cc'ed to pcp °

## 2015-10-30 NOTE — Progress Notes (Addendum)
Patient ID: Sonya Padilla, female   DOB: [birth year], 20 y.o.   MRN: [id number]  G2P1001 [redacted]w[redacted]d Estimated Date of Delivery: 01/21/16 patient knowledge levels suspect.She has apparent concern over oligohydramnios dx in the last pregnancy when she called our office at 39+ wk, was advised to go to [facility] for eval for ROM, and instead "because it was closer" she went to [facility], where eval included u/s which showed low afi of 3.6, so pt was induced. Pt wants to be checked for fluid volume now in this pregnancy. Good FM,  US done to help explain and assure pt. Blood pressure 118/60, pulse 93, weight 248 lb 8 oz (112.719 kg), last menstrual period 04/09/2015, not currently breastfeeding.   refer to the ob flow sheet for FH and FHR, also BP, Wt, Urine results:notable for trace protein  Patient reports  + good fetal movement, denies any bleeding and no rupture of membranes symptoms or regular contractions. Patient complains of lower back pain, not relieved by any treatments at home. Patient also requests check of amniotic fluid volume this visit, as she was induced last delivery (04/16/2014) for [employer] .  FHR: 152 bpm FH: 27 cm  Questions were answered. Assessment: LROB G2P1001 @ [redacted]w[redacted]d                         History of IOL at 39w1 day for oligohydramnios when seen at [facility] for labor check(AFI 3.6)   U/S performed to evaluate amniotic fluid levels, which measure 13.1 cm. Pt reassured, AND even AFTER this lengthy explanation and eval, pt remains unconvinced. Pt advised to let us know each visit if she feels she is growing appropriately, or other concerns.  Plan:  Continued routine obstetrical care,   F/u in 4 weeks for pnx care   By signing my name below, I, Stephania Fragmin, attest that this documentation has been prepared under the direction and in the presence of Jonnie Kind, MD. Electronically Signed: Stephania Fragmin, ED Scribe. 10/30/2015. 10:18 AM.  I personally performed  the services described in this documentation, which was SCRIBED in my presence. The recorded information has been reviewed and considered accurate. It has been edited as necessary during review. Jonnie Kind, MD

## 2015-10-30 NOTE — Assessment & Plan Note (Signed)
Patient with a history of GERD with erosive esophagitis. Her Dexilant was discontinued by OB due to pregnancy. Had an extensive discussion about GERD in pregnancy and management through diet and lifestyle changes. She admits a significant portion of her symptoms are occurring when she eats foods that she knows will cause symptoms. Recommend she avoid these trigger foods. Return for follow-up in 6 months which will be approximately 3 months postpartum to discuss possible PPI reinitiation due to GERD with erosive reflux esophagitis.

## 2015-10-30 NOTE — Progress Notes (Signed)
Pt states that she has continuous lower back pain, nothing helps relieve the pain.

## 2015-10-31 LAB — GLUCOSE TOLERANCE, 2 HOURS W/ 1HR
GLUCOSE, 2 HOUR: 147 mg/dL (ref 65–152)
GLUCOSE, FASTING: 83 mg/dL (ref 65–91)
Glucose, 1 hour: 148 mg/dL (ref 65–179)

## 2015-10-31 LAB — CBC
HEMATOCRIT: 33 % — AB (ref 34.0–46.6)
HEMOGLOBIN: 11 g/dL — AB (ref 11.1–15.9)
MCH: 29.8 pg (ref 26.6–33.0)
MCHC: 33.3 g/dL (ref 31.5–35.7)
MCV: 89 fL (ref 79–97)
Platelets: 188 10*3/uL (ref 150–379)
RBC: 3.69 x10E6/uL — ABNORMAL LOW (ref 3.77–5.28)
RDW: 13.5 % (ref 12.3–15.4)
WBC: 11.2 10*3/uL — AB (ref 3.4–10.8)

## 2015-10-31 LAB — HIV ANTIBODY (ROUTINE TESTING W REFLEX): HIV SCREEN 4TH GENERATION: NONREACTIVE

## 2015-10-31 LAB — RPR: RPR: NONREACTIVE

## 2015-10-31 LAB — ANTIBODY SCREEN: Antibody Screen: NEGATIVE

## 2015-10-31 MED ORDER — POLYETHYLENE GLYCOL 3350 17 GM/SCOOP PO POWD: 17.0000 g | Freq: Every day | ORAL | Status: DC

## 2015-10-31 NOTE — Progress Notes (Signed)
Lengthy discussion to try to assess and interpret pt concerns, and address them. Over 25 minutes, then u/s done.

## 2015-11-12 ENCOUNTER — Telehealth: Payer: Self-pay | Admitting: Women's Health

## 2015-11-12 NOTE — Telephone Encounter (Signed)
Pt states she has a rashes with itch x 3 years, powders make rash worse, has never discussed with any of our providers. Pt states she has an appt 11/27/2015 and would like to discuss with Knute Neu, CNM at that time. Pt offered an appt sooner but states she feels comfortable waiting and discussing at the time of her 11/27/2015 appt.

## 2015-11-27 ENCOUNTER — Encounter: Payer: Self-pay | Admitting: Women's Health

## 2015-11-27 ENCOUNTER — Ambulatory Visit (INDEPENDENT_AMBULATORY_CARE_PROVIDER_SITE_OTHER): Payer: Medicaid Other | Admitting: Women's Health

## 2015-11-27 VITALS — BP 120/62 | HR 92 | Wt 247.0 lb

## 2015-11-27 DIAGNOSIS — Z3493 Encounter for supervision of normal pregnancy, unspecified, third trimester: Secondary | ICD-10-CM

## 2015-11-27 DIAGNOSIS — O26893 Other specified pregnancy related conditions, third trimester: Secondary | ICD-10-CM

## 2015-11-27 DIAGNOSIS — N949 Unspecified condition associated with female genital organs and menstrual cycle: Secondary | ICD-10-CM

## 2015-11-27 DIAGNOSIS — O360130 Maternal care for anti-D [Rh] antibodies, third trimester, not applicable or unspecified: Secondary | ICD-10-CM

## 2015-11-27 DIAGNOSIS — Z3A32 32 weeks gestation of pregnancy: Secondary | ICD-10-CM

## 2015-11-27 DIAGNOSIS — Z1389 Encounter for screening for other disorder: Secondary | ICD-10-CM

## 2015-11-27 DIAGNOSIS — Z3483 Encounter for supervision of other normal pregnancy, third trimester: Secondary | ICD-10-CM

## 2015-11-27 DIAGNOSIS — Z6791 Unspecified blood type, Rh negative: Secondary | ICD-10-CM

## 2015-11-27 DIAGNOSIS — Z331 Pregnant state, incidental: Secondary | ICD-10-CM

## 2015-11-27 LAB — POCT URINALYSIS DIPSTICK
Blood, UA: NEGATIVE
Glucose, UA: NEGATIVE
KETONES UA: NEGATIVE
LEUKOCYTES UA: NEGATIVE
NITRITE UA: NEGATIVE

## 2015-11-27 LAB — POCT WET PREP (WET MOUNT): Clue Cells Wet Prep Whiff POC: NEGATIVE

## 2015-11-27 MED ORDER — RHO D IMMUNE GLOBULIN 1500 UNIT/2ML IJ SOSY
300.0000 ug | PREFILLED_SYRINGE | Freq: Once | INTRAMUSCULAR | Status: AC
  Administered 2015-11-27: 300 ug via INTRAMUSCULAR

## 2015-11-27 NOTE — Patient Instructions (Signed)

## 2015-11-27 NOTE — Progress Notes (Signed)
Low-risk OB appointment G2P1001 [redacted]w[redacted]d Estimated Date of Delivery: 01/21/16 BP 120/62 mmHg  Pulse 92  Wt 247 lb (112.038 kg)  LMP 04/09/2015 (Exact Date)  BP, weight, and urine reviewed.  Refer to obstetrical flow sheet for FH & FHR.  Reports good fm.  Denies regular uc's, lof, vb, or uti s/s. Pressure, lost mucous plug 5/15. Has had sex w/in last 24hrs. Has not gotten fetal echo for h/o fob being born w/ congenital heart defect- will fax referral today.  Spec exam: cx visually closed, small amt creamy white nonodorous d/c, wet prep mod wbc's otherwise neg- will send gc/ct SVE: outer os 1-2,inner os closed/20/-2, vtx Reviewed ptl s/s, fkc, reasons to seek care. Can try pregnancy belt for pressure- if any changes/sx of ptl call us or go to [facility].  Plan:  Continue routine obstetrical care  F/U in 2wks for OB appointment  Rhogam today

## 2015-11-29 LAB — GC/CHLAMYDIA PROBE AMP
Chlamydia trachomatis, NAA: NEGATIVE
Neisseria gonorrhoeae by PCR: NEGATIVE

## 2015-12-10 ENCOUNTER — Telehealth: Payer: Self-pay | Admitting: Women's Health

## 2015-12-10 NOTE — Telephone Encounter (Signed)
Pt informed to call [phone] to confirm her appt for fetal echo, also address is [place]. Pt verbalized understanding.

## 2015-12-11 ENCOUNTER — Encounter: Payer: Self-pay | Admitting: Women's Health

## 2015-12-11 ENCOUNTER — Ambulatory Visit (INDEPENDENT_AMBULATORY_CARE_PROVIDER_SITE_OTHER): Payer: Medicaid Other | Admitting: Women's Health

## 2015-12-11 VITALS — BP 120/62 | HR 102 | Wt 254.0 lb

## 2015-12-11 DIAGNOSIS — Z331 Pregnant state, incidental: Secondary | ICD-10-CM

## 2015-12-11 DIAGNOSIS — O1213 Gestational proteinuria, third trimester: Secondary | ICD-10-CM

## 2015-12-11 DIAGNOSIS — Z3A35 35 weeks gestation of pregnancy: Secondary | ICD-10-CM

## 2015-12-11 DIAGNOSIS — Z3483 Encounter for supervision of other normal pregnancy, third trimester: Secondary | ICD-10-CM

## 2015-12-11 DIAGNOSIS — Z3493 Encounter for supervision of normal pregnancy, unspecified, third trimester: Secondary | ICD-10-CM

## 2015-12-11 DIAGNOSIS — N898 Other specified noninflammatory disorders of vagina: Secondary | ICD-10-CM

## 2015-12-11 DIAGNOSIS — Z1389 Encounter for screening for other disorder: Secondary | ICD-10-CM

## 2015-12-11 DIAGNOSIS — O26893 Other specified pregnancy related conditions, third trimester: Secondary | ICD-10-CM

## 2015-12-11 LAB — POCT URINALYSIS DIPSTICK
GLUCOSE UA: NEGATIVE
KETONES UA: NEGATIVE
NITRITE UA: NEGATIVE

## 2015-12-11 LAB — POCT WET PREP (WET MOUNT): CLUE CELLS WET PREP WHIFF POC: POSITIVE

## 2015-12-11 MED ORDER — METRONIDAZOLE 500 MG PO TABS: 500.0000 mg | ORAL_TABLET | Freq: Two times a day (BID) | ORAL | Status: DC

## 2015-12-11 NOTE — Patient Instructions (Signed)
Call the office ([phone]) or go to [facility] if:  You begin to have strong, frequent contractions  Your water breaks.  Sometimes it is a big gush of fluid, sometimes it is just a trickle that keeps getting your panties wet or running down your legs  You have vaginal bleeding.  It is normal to have a small amount of spotting if your cervix was checked.   You don't feel your baby moving like normal.  If you don't, get you something to eat and drink and lay down and focus on feeling your baby move.  You should feel at least 10 movements in 2 hours.  If you don't, you should call the office or go to [facility].    Preterm Labor Information Preterm labor is when labor starts at less than 37 weeks of pregnancy. The normal length of a pregnancy is 39 to 41 weeks. CAUSES Often, there is no identifiable underlying cause as to why a woman goes into preterm labor. One of the most common known causes of preterm labor is infection. Infections of the uterus, cervix, vagina, amniotic sac, bladder, kidney, or even the lungs (pneumonia) can cause labor to start. Other suspected causes of preterm labor include:   Urogenital infections, such as yeast infections and bacterial vaginosis.   Uterine abnormalities (uterine shape, uterine septum, fibroids, or bleeding from the placenta).   A cervix that has been operated on (it may fail to stay closed).   Malformations in the fetus.   Multiple gestations (twins, triplets, and so on).   Breakage of the amniotic sac.  RISK FACTORS  Having a previous history of preterm labor.   Having premature rupture of membranes (PROM).   Having a placenta that covers the opening of the cervix (placenta previa).   Having a placenta that separates from the uterus (placental abruption).   Having a cervix that is too weak to hold the fetus in the uterus (incompetent cervix).   Having too much fluid in the amniotic sac (polyhydramnios).   Taking  illegal drugs or smoking while pregnant.   Not gaining enough weight while pregnant.   Being younger than 18 and older than 20 years old.   Having a low socioeconomic status.   Being African American. SYMPTOMS Signs and symptoms of preterm labor include:   Menstrual-like cramps, abdominal pain, or back pain.  Uterine contractions that are regular, as frequent as six in an hour, regardless of their intensity (may be mild or painful).  Contractions that start on the top of the uterus and spread down to the lower abdomen and back.   A sense of increased pelvic pressure.   A watery or bloody mucus discharge that comes from the vagina.  TREATMENT Depending on the length of the pregnancy and other circumstances, your health care provider may suggest bed rest. If necessary, there are medicines that can be given to stop contractions and to mature the fetal lungs. If labor happens before 34 weeks of pregnancy, a prolonged hospital stay may be recommended. Treatment depends on the condition of both you and the fetus.  WHAT SHOULD YOU DO IF YOU THINK YOU ARE IN PRETERM LABOR? Call your health care provider right away. You will need to go to the hospital to get checked immediately. HOW CAN YOU PREVENT PRETERM LABOR IN FUTURE PREGNANCIES? You should:   Stop smoking if you smoke.  Maintain healthy weight gain and avoid chemicals and drugs that are not necessary.  Be watchful for   any type of infection.  Inform your health care provider if you have a known history of preterm labor.   This information is not intended to replace advice given to you by your health care provider. Make sure you discuss any questions you have with your health care provider.   Document Released: 09/19/2003 Document Revised: 03/01/2013 Document Reviewed: 08/01/2012 Elsevier Interactive Patient Education 2016 Elsevier Inc.  

## 2015-12-11 NOTE — Progress Notes (Signed)
Low-risk OB appointment G2P1001 [redacted]w[redacted]d Estimated Date of Delivery: 01/21/16 BP 120/62 mmHg  Pulse 102  Wt 254 lb (115.214 kg)  LMP 04/09/2015 (Exact Date)  BP, weight, and urine reviewed.  Refer to obstetrical flow sheet for FH & FHR.  Reports good fm.  Denies regular uc's, lof, vb, or uti s/s. Lots of pressure, and when sitting upright feels like something is 'there'. Has fetal echo appt tomorrow for fob being born w/ bicuspid aortic valve Spec exam: cx visually closed, mod amt thin white malodorous d/c, wet prep few wbc's, few clues Rx metronidazole 500mg  BID x 7d for BV, no sex or etoh while taking  SVE: 2-3/50/ballotable, vtx Reviewed ptl s/s, fkc, recommended pelvic rest for now. Increase water intake Plan:  Continue routine obstetrical care  F/U in 2wks for OB appointment

## 2015-12-12 ENCOUNTER — Telehealth: Payer: Self-pay | Admitting: Advanced Practice Midwife

## 2015-12-12 NOTE — Telephone Encounter (Signed)
Pt informed per Nigel Berthold, CNM, no bedrest due to increased risk for blood clots. Ok to go into labor at 34 weeks. Pt verbalized understanding.

## 2015-12-12 NOTE — Telephone Encounter (Signed)
Bed rest can cause blood clots, so don't do that.  It is OK to go into labor after 34 weeks. If she isn't working, IDK who she wants me to tell that she has to stay in bed...Marland KitchenMarland Kitchen

## 2015-12-12 NOTE — Telephone Encounter (Signed)
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

## 2015-12-13 LAB — URINE CULTURE

## 2015-12-25 ENCOUNTER — Encounter: Payer: Self-pay | Admitting: Women's Health

## 2015-12-25 ENCOUNTER — Ambulatory Visit (INDEPENDENT_AMBULATORY_CARE_PROVIDER_SITE_OTHER): Payer: Medicaid Other | Admitting: Women's Health

## 2015-12-25 VITALS — BP 124/66 | HR 80 | Wt 258.0 lb

## 2015-12-25 DIAGNOSIS — N898 Other specified noninflammatory disorders of vagina: Secondary | ICD-10-CM | POA: Diagnosis not present

## 2015-12-25 DIAGNOSIS — Z3493 Encounter for supervision of normal pregnancy, unspecified, third trimester: Secondary | ICD-10-CM

## 2015-12-25 DIAGNOSIS — Z331 Pregnant state, incidental: Secondary | ICD-10-CM

## 2015-12-25 DIAGNOSIS — Z1389 Encounter for screening for other disorder: Secondary | ICD-10-CM

## 2015-12-25 DIAGNOSIS — O26893 Other specified pregnancy related conditions, third trimester: Secondary | ICD-10-CM

## 2015-12-25 LAB — POCT WET PREP (WET MOUNT): CLUE CELLS WET PREP WHIFF POC: NEGATIVE

## 2015-12-25 LAB — POCT URINALYSIS DIPSTICK
GLUCOSE UA: NEGATIVE
Ketones, UA: NEGATIVE
Leukocytes, UA: NEGATIVE
Nitrite, UA: NEGATIVE
Protein, UA: NEGATIVE
RBC UA: NEGATIVE

## 2015-12-25 NOTE — Progress Notes (Signed)
Low-risk OB appointment G2P1001 [redacted]w[redacted]d Estimated Date of Delivery: 01/21/16 BP 124/66 mmHg  Pulse 80  Wt 258 lb (117.028 kg)  LMP 04/09/2015 (Exact Date)  BP, weight, and urine reviewed.  Refer to obstetrical flow sheet for FH & FHR.  Reports good fm.  Denies regular uc's, lof, vb, or uti s/s. White slimy d/c, no itching/odor/irritation, not r/t sex- took picture- looks like egg whites, possible mucous plug.  Spec exam: cx visually closed, wet prep neg, SVE 3/50/-2, vtx Reviewed ptl s/s, fkc. Plan:  Continue routine obstetrical care  F/U in 1wk for OB appointment and gbs

## 2015-12-25 NOTE — Patient Instructions (Signed)
Call the office ([phone]) or go to [facility] if:  You begin to have strong, frequent contractions  Your water breaks.  Sometimes it is a big gush of fluid, sometimes it is just a trickle that keeps getting your panties wet or running down your legs  You have vaginal bleeding.  It is normal to have a small amount of spotting if your cervix was checked.   You don't feel your baby moving like normal.  If you don't, get you something to eat and drink and lay down and focus on feeling your baby move.  You should feel at least 10 movements in 2 hours.  If you don't, you should call the office or go to [facility].    Preterm Labor Information Preterm labor is when labor starts at less than 37 weeks of pregnancy. The normal length of a pregnancy is 39 to 41 weeks. CAUSES Often, there is no identifiable underlying cause as to why a woman goes into preterm labor. One of the most common known causes of preterm labor is infection. Infections of the uterus, cervix, vagina, amniotic sac, bladder, kidney, or even the lungs (pneumonia) can cause labor to start. Other suspected causes of preterm labor include:   Urogenital infections, such as yeast infections and bacterial vaginosis.   Uterine abnormalities (uterine shape, uterine septum, fibroids, or bleeding from the placenta).   A cervix that has been operated on (it may fail to stay closed).   Malformations in the fetus.   Multiple gestations (twins, triplets, and so on).   Breakage of the amniotic sac.  RISK FACTORS  Having a previous history of preterm labor.   Having premature rupture of membranes (PROM).   Having a placenta that covers the opening of the cervix (placenta previa).   Having a placenta that separates from the uterus (placental abruption).   Having a cervix that is too weak to hold the fetus in the uterus (incompetent cervix).   Having too much fluid in the amniotic sac (polyhydramnios).   Taking  illegal drugs or smoking while pregnant.   Not gaining enough weight while pregnant.   Being younger than 18 and older than 20 years old.   Having a low socioeconomic status.   Being African American. SYMPTOMS Signs and symptoms of preterm labor include:   Menstrual-like cramps, abdominal pain, or back pain.  Uterine contractions that are regular, as frequent as six in an hour, regardless of their intensity (may be mild or painful).  Contractions that start on the top of the uterus and spread down to the lower abdomen and back.   A sense of increased pelvic pressure.   A watery or bloody mucus discharge that comes from the vagina.  TREATMENT Depending on the length of the pregnancy and other circumstances, your health care provider may suggest bed rest. If necessary, there are medicines that can be given to stop contractions and to mature the fetal lungs. If labor happens before 34 weeks of pregnancy, a prolonged hospital stay may be recommended. Treatment depends on the condition of both you and the fetus.  WHAT SHOULD YOU DO IF YOU THINK YOU ARE IN PRETERM LABOR? Call your health care provider right away. You will need to go to the hospital to get checked immediately. HOW CAN YOU PREVENT PRETERM LABOR IN FUTURE PREGNANCIES? You should:   Stop smoking if you smoke.  Maintain healthy weight gain and avoid chemicals and drugs that are not necessary.  Be watchful for   any type of infection.  Inform your health care provider if you have a known history of preterm labor.   This information is not intended to replace advice given to you by your health care provider. Make sure you discuss any questions you have with your health care provider.   Document Released: 09/19/2003 Document Revised: 03/01/2013 Document Reviewed: 08/01/2012 Elsevier Interactive Patient Education 2016 Elsevier Inc.  

## 2016-01-01 ENCOUNTER — Ambulatory Visit (INDEPENDENT_AMBULATORY_CARE_PROVIDER_SITE_OTHER): Payer: Medicaid Other | Admitting: Women's Health

## 2016-01-01 ENCOUNTER — Encounter: Payer: Self-pay | Admitting: Women's Health

## 2016-01-01 VITALS — BP 108/68 | HR 80 | Wt 258.0 lb

## 2016-01-01 DIAGNOSIS — Z1159 Encounter for screening for other viral diseases: Secondary | ICD-10-CM

## 2016-01-01 DIAGNOSIS — Z118 Encounter for screening for other infectious and parasitic diseases: Secondary | ICD-10-CM

## 2016-01-01 DIAGNOSIS — Z1389 Encounter for screening for other disorder: Secondary | ICD-10-CM

## 2016-01-01 DIAGNOSIS — Z3493 Encounter for supervision of normal pregnancy, unspecified, third trimester: Secondary | ICD-10-CM

## 2016-01-01 DIAGNOSIS — Z331 Pregnant state, incidental: Secondary | ICD-10-CM

## 2016-01-01 DIAGNOSIS — Z3685 Encounter for antenatal screening for Streptococcus B: Secondary | ICD-10-CM

## 2016-01-01 LAB — POCT URINALYSIS DIPSTICK
Ketones, UA: NEGATIVE
LEUKOCYTES UA: NEGATIVE
NITRITE UA: NEGATIVE
RBC UA: NEGATIVE

## 2016-01-01 NOTE — Patient Instructions (Signed)
Call the office ([phone]) or go to [facility] if:  You begin to have strong, frequent contractions  Your water breaks.  Sometimes it is a big gush of fluid, sometimes it is just a trickle that keeps getting your panties wet or running down your legs  You have vaginal bleeding.  It is normal to have a small amount of spotting if your cervix was checked.   You don't feel your baby moving like normal.  If you don't, get you something to eat and drink and lay down and focus on feeling your baby move.  You should feel at least 10 movements in 2 hours.  If you don't, you should call the office or go to [facility].    Braxton Hicks Contractions Contractions of the uterus can occur throughout pregnancy. Contractions are not always a sign that you are in labor.  WHAT ARE BRAXTON HICKS CONTRACTIONS?  Contractions that occur before labor are called Braxton Hicks contractions, or false labor. Toward the end of pregnancy (32-34 weeks), these contractions can develop more often and may become more forceful. This is not true labor because these contractions do not result in opening (dilatation) and thinning of the cervix. They are sometimes difficult to tell apart from true labor because these contractions can be forceful and people have different pain tolerances. You should not feel embarrassed if you go to the hospital with false labor. Sometimes, the only way to tell if you are in true labor is for your health care provider to look for changes in the cervix. If there are no prenatal problems or other health problems associated with the pregnancy, it is completely safe to be sent home with false labor and await the onset of true labor. HOW CAN YOU TELL THE DIFFERENCE BETWEEN TRUE AND FALSE LABOR? False Labor  The contractions of false labor are usually shorter and not as hard as those of true labor.   The contractions are usually irregular.   The contractions are often felt in the front of  the lower abdomen and in the groin.   The contractions may go away when you walk around or change positions while lying down.   The contractions get weaker and are shorter lasting as time goes on.   The contractions do not usually become progressively stronger, regular, and closer together as with true labor.  True Labor  Contractions in true labor last 30-70 seconds, become very regular, usually become more intense, and increase in frequency.   The contractions do not go away with walking.   The discomfort is usually felt in the top of the uterus and spreads to the lower abdomen and low back.   True labor can be determined by your health care provider with an exam. This will show that the cervix is dilating and getting thinner.  WHAT TO REMEMBER  Keep up with your usual exercises and follow other instructions given by your health care provider.   Take medicines as directed by your health care provider.   Keep your regular prenatal appointments.   Eat and drink lightly if you think you are going into labor.   If Braxton Hicks contractions are making you uncomfortable:   Change your position from lying down or resting to walking, or from walking to resting.   Sit and rest in a tub of warm water.   Drink 2-3 glasses of water. Dehydration may cause these contractions.   Do slow and deep breathing several times an hour.    WHEN SHOULD I SEEK IMMEDIATE MEDICAL CARE? Seek immediate medical care if:  Your contractions become stronger, more regular, and closer together.   You have fluid leaking or gushing from your vagina.   You have a fever.   You pass blood-tinged mucus.   You have vaginal bleeding.   You have continuous abdominal pain.   You have low back pain that you never had before.   You feel your baby's head pushing down and causing pelvic pressure.   Your baby is not moving as much as it used to.    This information is not intended to  replace advice given to you by your health care provider. Make sure you discuss any questions you have with your health care provider.   Document Released: 06/29/2005 Document Revised: 07/04/2013 Document Reviewed: 04/10/2013 Elsevier Interactive Patient Education [employer].

## 2016-01-01 NOTE — Progress Notes (Signed)
Low-risk OB appointment G2P1001 [redacted]w[redacted]d Estimated Date of Delivery: 01/21/16 BP 108/68 mmHg  Pulse 80  Wt 258 lb (117.028 kg)  LMP 04/09/2015 (Exact Date)  BP, weight, and urine reviewed.  Refer to obstetrical flow sheet for FH & FHR.  Reports good fm.  Denies regular uc's, lof, vb, or uti s/s. Cramping GBS, gc/ct collected SVE: 4/50/-2, vtx, posterior Reviewed labor s/s, fkc. Plan:  Continue routine obstetrical care  F/U in 1wk for OB appointment

## 2016-01-03 LAB — STREP GP B NAA: STREP GROUP B AG: NEGATIVE

## 2016-01-04 LAB — GC/CHLAMYDIA PROBE AMP
Chlamydia trachomatis, NAA: NEGATIVE
NEISSERIA GONORRHOEAE BY PCR: NEGATIVE

## 2016-01-08 ENCOUNTER — Ambulatory Visit (INDEPENDENT_AMBULATORY_CARE_PROVIDER_SITE_OTHER): Payer: Medicaid Other | Admitting: Women's Health

## 2016-01-08 ENCOUNTER — Encounter: Payer: Self-pay | Admitting: Women's Health

## 2016-01-08 VITALS — BP 110/68 | HR 88 | Wt 262.0 lb

## 2016-01-08 DIAGNOSIS — N898 Other specified noninflammatory disorders of vagina: Secondary | ICD-10-CM | POA: Diagnosis not present

## 2016-01-08 DIAGNOSIS — O26893 Other specified pregnancy related conditions, third trimester: Secondary | ICD-10-CM

## 2016-01-08 DIAGNOSIS — Z3493 Encounter for supervision of normal pregnancy, unspecified, third trimester: Secondary | ICD-10-CM

## 2016-01-08 DIAGNOSIS — Z3A38 38 weeks gestation of pregnancy: Secondary | ICD-10-CM

## 2016-01-08 DIAGNOSIS — Z3483 Encounter for supervision of other normal pregnancy, third trimester: Secondary | ICD-10-CM

## 2016-01-08 DIAGNOSIS — Z1389 Encounter for screening for other disorder: Secondary | ICD-10-CM

## 2016-01-08 DIAGNOSIS — Z331 Pregnant state, incidental: Secondary | ICD-10-CM

## 2016-01-08 LAB — POCT URINALYSIS DIPSTICK
Blood, UA: NEGATIVE
Glucose, UA: NEGATIVE
Ketones, UA: NEGATIVE
NITRITE UA: NEGATIVE
PROTEIN UA: NEGATIVE

## 2016-01-08 LAB — POCT WET PREP (WET MOUNT): Clue Cells Wet Prep Whiff POC: NEGATIVE

## 2016-01-08 NOTE — Patient Instructions (Signed)
Call the office ([phone]) or go to [facility] if:  You begin to have strong, frequent contractions  Your water breaks.  Sometimes it is a big gush of fluid, sometimes it is just a trickle that keeps getting your panties wet or running down your legs  You have vaginal bleeding.  It is normal to have a small amount of spotting if your cervix was checked.   You don't feel your baby moving like normal.  If you don't, get you something to eat and drink and lay down and focus on feeling your baby move.  You should feel at least 10 movements in 2 hours.  If you don't, you should call the office or go to [facility].    Braxton Hicks Contractions Contractions of the uterus can occur throughout pregnancy. Contractions are not always a sign that you are in labor.  WHAT ARE BRAXTON HICKS CONTRACTIONS?  Contractions that occur before labor are called Braxton Hicks contractions, or false labor. Toward the end of pregnancy (32-34 weeks), these contractions can develop more often and may become more forceful. This is not true labor because these contractions do not result in opening (dilatation) and thinning of the cervix. They are sometimes difficult to tell apart from true labor because these contractions can be forceful and people have different pain tolerances. You should not feel embarrassed if you go to the hospital with false labor. Sometimes, the only way to tell if you are in true labor is for your health care provider to look for changes in the cervix. If there are no prenatal problems or other health problems associated with the pregnancy, it is completely safe to be sent home with false labor and await the onset of true labor. HOW CAN YOU TELL THE DIFFERENCE BETWEEN TRUE AND FALSE LABOR? False Labor  The contractions of false labor are usually shorter and not as hard as those of true labor.   The contractions are usually irregular.   The contractions are often felt in the front of  the lower abdomen and in the groin.   The contractions may go away when you walk around or change positions while lying down.   The contractions get weaker and are shorter lasting as time goes on.   The contractions do not usually become progressively stronger, regular, and closer together as with true labor.  True Labor  Contractions in true labor last 30-70 seconds, become very regular, usually become more intense, and increase in frequency.   The contractions do not go away with walking.   The discomfort is usually felt in the top of the uterus and spreads to the lower abdomen and low back.   True labor can be determined by your health care provider with an exam. This will show that the cervix is dilating and getting thinner.  WHAT TO REMEMBER  Keep up with your usual exercises and follow other instructions given by your health care provider.   Take medicines as directed by your health care provider.   Keep your regular prenatal appointments.   Eat and drink lightly if you think you are going into labor.   If Braxton Hicks contractions are making you uncomfortable:   Change your position from lying down or resting to walking, or from walking to resting.   Sit and rest in a tub of warm water.   Drink 2-3 glasses of water. Dehydration may cause these contractions.   Do slow and deep breathing several times an hour.    WHEN SHOULD I SEEK IMMEDIATE MEDICAL CARE? Seek immediate medical care if:  Your contractions become stronger, more regular, and closer together.   You have fluid leaking or gushing from your vagina.   You have a fever.   You pass blood-tinged mucus.   You have vaginal bleeding.   You have continuous abdominal pain.   You have low back pain that you never had before.   You feel your baby's head pushing down and causing pelvic pressure.   Your baby is not moving as much as it used to.    This information is not intended to  replace advice given to you by your health care provider. Make sure you discuss any questions you have with your health care provider.   Document Released: 06/29/2005 Document Revised: 07/04/2013 Document Reviewed: 04/10/2013 Elsevier Interactive Patient Education [employer].

## 2016-01-08 NOTE — Progress Notes (Signed)
Low-risk OB appointment G2P1001 [redacted]w[redacted]d Estimated Date of Delivery: 01/21/16 BP 110/68 mmHg  Pulse 88  Wt 262 lb (118.842 kg)  LMP 04/09/2015 (Exact Date)  BP, weight, and urine reviewed.  Refer to obstetrical flow sheet for FH & FHR.  Reports good fm.  Denies regular uc's, lof, vb, or uti s/s. Green d/c since Sun, no itching/odor/irritation SSE: cx multiparous and floppy, lots of mucous, no pooling, no change w/ valsalva, fern and nitrazine neg. Wet prep: mod wbc's, otherwise neg SVE per request: 4.5/70/ballotable, vtx Reviewed gbs neg, labor s/s, fkc. Plan:  Continue routine obstetrical care  F/U in 1wk for OB appointment

## 2016-01-10 ENCOUNTER — Inpatient Hospital Stay (HOSPITAL_COMMUNITY)
Admission: AD | Admit: 2016-01-10 | Discharge: 2016-01-10 | Disposition: A | Discharge status: Home / Self Care | Payer: Medicaid Other | Source: Ambulatory Visit | Attending: Family Medicine | Admitting: Family Medicine

## 2016-01-10 ENCOUNTER — Encounter (HOSPITAL_COMMUNITY): Payer: Self-pay | Admitting: *Deleted

## 2016-01-10 DIAGNOSIS — Z3A38 38 weeks gestation of pregnancy: Secondary | ICD-10-CM | POA: Insufficient documentation

## 2016-01-10 DIAGNOSIS — O26893 Other specified pregnancy related conditions, third trimester: Secondary | ICD-10-CM | POA: Insufficient documentation

## 2016-01-10 DIAGNOSIS — Z888 Allergy status to other drugs, medicaments and biological substances status: Secondary | ICD-10-CM | POA: Diagnosis not present

## 2016-01-10 DIAGNOSIS — Z3493 Encounter for supervision of normal pregnancy, unspecified, third trimester: Secondary | ICD-10-CM

## 2016-01-10 DIAGNOSIS — Z87891 Personal history of nicotine dependence: Secondary | ICD-10-CM | POA: Insufficient documentation

## 2016-01-10 LAB — URINALYSIS, ROUTINE W REFLEX MICROSCOPIC
Bilirubin Urine: NEGATIVE
Glucose, UA: NEGATIVE mg/dL
Hgb urine dipstick: NEGATIVE
KETONES UR: NEGATIVE mg/dL
NITRITE: NEGATIVE
PH: 6 (ref 5.0–8.0)
Protein, ur: NEGATIVE mg/dL
SPECIFIC GRAVITY, URINE: 1.02 (ref 1.005–1.030)

## 2016-01-10 LAB — WET PREP, GENITAL
CLUE CELLS WET PREP: NONE SEEN
Sperm: NONE SEEN
Trich, Wet Prep: NONE SEEN
Yeast Wet Prep HPF POC: NONE SEEN

## 2016-01-10 LAB — AMNISURE RUPTURE OF MEMBRANE (ROM) NOT AT ARMC: AMNISURE: NEGATIVE

## 2016-01-10 LAB — URINE MICROSCOPIC-ADD ON: RBC / HPF: NONE SEEN RBC/hpf (ref 0–5)

## 2016-01-10 MED ORDER — LACTATED RINGERS IV BOLUS (SEPSIS)
1000.0000 mL | Freq: Once | INTRAVENOUS | Status: AC
  Administered 2016-01-10: 1000 mL via INTRAVENOUS

## 2016-01-10 NOTE — MAU Provider Note (Signed)
MAU HISTORY AND PHYSICAL  Chief Complaint:  Rupture of Membranes   Sonya Padilla is a 20 y.o.  G2P1001 with IUP at [redacted]w[redacted]d presenting for Rupture of Membranes Was using a bathroom when she noted gush and trickle of clear fluid abuot 2030. She reports having trickle since then also the pad she was given on arrival to MAU is still dry. Reports feeling pain in her back, leg, vagina and pelvis for two weeks. Reports that baby is not moving as usual since 1700 hours. No vaginal bleeding, dysuria. Reports increased vaginal discharge since she was in third trimester. Denies fall or trauma. Report runny nose since yesterday. Denies fever at home. Eating and drinking well.  Denies smoking, EtOH or drug use. Reports headache when she takes tylenol.   Past Medical History  Diagnosis Date  . Obesity   . Broken leg     left  . Depression   . Suicide ([place])   . Amenorrhea 10/02/2014  . RLQ abdominal pain 10/02/2014  . Nausea 03/13/2015  . Urinary frequency 03/13/2015  . Irregular periods 05/13/2015  . Pelvic pain in female 05/13/2015  . Pregnant 05/13/2015    Past Surgical History  Procedure Laterality Date  . Esophagogastroduodenoscopy N/A 12/12/2014    [id number] reflux     Family History  Problem Relation Age of Onset  . Hypertension Mother   . Stroke Paternal Aunt   . Diabetes Maternal Grandmother   . Heart disease Maternal Grandmother   . Hyperlipidemia Maternal Grandmother   . Drug abuse Maternal Grandmother   . Glaucoma Maternal Grandmother   . Cataracts Maternal Grandmother   . Cancer Paternal Grandfather   . Psoriasis Sister   . Cervical cancer Maternal Aunt   . Physical abuse Maternal Uncle   . Prostate cancer Maternal Grandfather   . Alcohol abuse Maternal Grandfather   . Obesity Other   . Bipolar disorder Other     paternal aunt and cousin  . Drug abuse Other   . Other Father     heart issues  . Heart attack Father   . Colon cancer Paternal Grandfather      Social History  Substance Use Topics  . Smoking status: Former Smoker -- 1.00 packs/day for 2 years    Types: Cigarettes    Quit date: 11/27/2012  . Smokeless tobacco: Former Systems developer    Types: Chew     Comment: quit in 04/2015  . Alcohol Use: No    Allergies  Allergen Reactions  . Tylenol [Acetaminophen] Other (See Comments)    Migraine    Prescriptions prior to admission  Medication Sig Dispense Refill Last Dose  . albuterol (PROAIR HFA) 108 (90 BASE) MCG/ACT inhaler Inhale into the lungs every 6 (six) hours as needed for wheezing or shortness of breath. Reported on 01/01/2016   Taking  . metroNIDAZOLE (FLAGYL) 500 MG tablet Take 1 tablet (500 mg total) by mouth 2 (two) times daily. X 7 days. No sex or alcohol while taking (Patient not taking: Reported on 01/01/2016) 14 tablet 0 Not Taking  . Prenat w/o A-FeCbGl-DSS-FA-DHA (CITRANATAL 90 DHA) 90-1 & 300 MG MISC Take 1 bid 60 each 11 Taking    Review of Systems - Negative except for what is mentioned in HPI.  Physical Exam  Blood pressure 136/76, pulse 116, temperature 99.8 F (37.7 C), temperature source Oral, resp. rate 18, height 5\' 5"  (1.651 m), weight 263 lb (119.296 kg), last menstrual period 04/09/2015, SpO2 98 %, not currently breastfeeding.  GENERAL: Well-developed, well-nourished female in no acute distress.  LUNGS: Clear to auscultation bilaterally.  HEART: Regular rate and rhythm. ABDOMEN: Soft, nontender, nondistended, gravid.  EXTREMITIES: Nontender, no edema, 2+ distal pulses. Cervical Exam: 4.5/70/-2 (unchanged from her exam two days ago) Presentation: cephalic FHT: 0000000 Contractions:Mild contraction every 3 minutes on toco but the patient is not feeling   Labs: Results for orders placed or performed during the hospital encounter of 01/10/16 (from the past 24 hour(s))  Wet prep, genital   Collection Time: 01/10/16  1:00 AM  Result Value Ref Range   Yeast Wet Prep HPF POC NONE SEEN NONE SEEN    Trich, Wet Prep NONE SEEN NONE SEEN   Clue Cells Wet Prep HPF POC NONE SEEN NONE SEEN   WBC, Wet Prep HPF POC MANY (A) NONE SEEN   Sperm NONE SEEN     Imaging Studies:  No results found.  Assessment: JOSHUA AMBRIZ is  20 y.o. G2P1001 at [redacted]w[redacted]d presents with concern for ruptured membranes which is unlikely after her exam and tests. Speculum exam negative for pooling of vaginal bleeding. Cervical exam unchanged from 2 days ago. Ferning and and amnisure negative. Wet prep negative. UA with rare bacteria and few leukocyte Estrace. GC/CT negative not far ago. Mild contraction noted on toco but patient is not feeling the contractions. Fetal heart rate initially about 170. Gave her 1 L of LR. Fetal heart tones 160/reactive after IV fluids.   Plan: Discharged home Labor symptoms and general obstetric precautions including but not limited to vaginal bleeding, contractions, leaking of fluid and fetal movement were reviewed in detail with the patient. Please refer to After Visit Summary for other counseling recommendations.   Mercy Riding 6/30/20171:25 AM   I supervised this resident and agree with the above .CRESENZO-DISHMAN,Deadrian Toya

## 2016-01-10 NOTE — Discharge Instructions (Signed)
It is nice taking care of you today! You were evaluated for concern for ruptured membrane. After the exams and testis we did, it is very unlikely that you have ruptured the membranes. It is also very reassuring that you baby is in good condition.Reasons to return to MAU:  1.  Contractions are  5 minutes apart or less, each last 1 minute, these have been going on for 1-2 hours, and you cannot walk or talk during them 2.  You have a large gush of fluid, or a trickle of fluid that will not stop and you have to wear a pad 3.  You have bleeding that is bright red, heavier than spotting--like menstrual bleeding (spotting can be normal in early labor or after a check of your cervix) 4.  You do not feel the baby moving like he/she normally does

## 2016-01-10 NOTE — MAU Note (Signed)
Pt states that water broke around 845pm. Constant trickling, clear. Regular contractions. +FM. 4.5cm on last SVE.

## 2016-01-11 ENCOUNTER — Inpatient Hospital Stay (HOSPITAL_COMMUNITY)
Admission: AD | Admit: 2016-01-11 | Discharge: 2016-01-11 | Disposition: A | Discharge status: Home / Self Care | Payer: Medicaid Other | Source: Ambulatory Visit | Attending: Family Medicine | Admitting: Family Medicine

## 2016-01-11 DIAGNOSIS — Z3483 Encounter for supervision of other normal pregnancy, third trimester: Secondary | ICD-10-CM | POA: Diagnosis not present

## 2016-01-11 DIAGNOSIS — O9989 Other specified diseases and conditions complicating pregnancy, childbirth and the puerperium: Secondary | ICD-10-CM

## 2016-01-11 DIAGNOSIS — Z6841 Body Mass Index (BMI) 40.0 and over, adult: Secondary | ICD-10-CM | POA: Insufficient documentation

## 2016-01-11 DIAGNOSIS — O26813 Pregnancy related exhaustion and fatigue, third trimester: Secondary | ICD-10-CM | POA: Diagnosis not present

## 2016-01-11 DIAGNOSIS — K219 Gastro-esophageal reflux disease without esophagitis: Secondary | ICD-10-CM | POA: Diagnosis not present

## 2016-01-11 DIAGNOSIS — R531 Weakness: Secondary | ICD-10-CM

## 2016-01-11 DIAGNOSIS — Z3493 Encounter for supervision of normal pregnancy, unspecified, third trimester: Secondary | ICD-10-CM

## 2016-01-11 DIAGNOSIS — O99213 Obesity complicating pregnancy, third trimester: Secondary | ICD-10-CM | POA: Diagnosis not present

## 2016-01-11 DIAGNOSIS — Z87891 Personal history of nicotine dependence: Secondary | ICD-10-CM | POA: Diagnosis not present

## 2016-01-11 DIAGNOSIS — O99343 Other mental disorders complicating pregnancy, third trimester: Secondary | ICD-10-CM | POA: Insufficient documentation

## 2016-01-11 DIAGNOSIS — O99613 Diseases of the digestive system complicating pregnancy, third trimester: Secondary | ICD-10-CM | POA: Diagnosis not present

## 2016-01-11 DIAGNOSIS — Z3A38 38 weeks gestation of pregnancy: Secondary | ICD-10-CM | POA: Insufficient documentation

## 2016-01-11 LAB — URINALYSIS, ROUTINE W REFLEX MICROSCOPIC
Bilirubin Urine: NEGATIVE
GLUCOSE, UA: NEGATIVE mg/dL
HGB URINE DIPSTICK: NEGATIVE
Ketones, ur: NEGATIVE mg/dL
Nitrite: NEGATIVE
PH: 6 (ref 5.0–8.0)
Protein, ur: NEGATIVE mg/dL
Specific Gravity, Urine: 1.015 (ref 1.005–1.030)

## 2016-01-11 LAB — URINE MICROSCOPIC-ADD ON: RBC / HPF: NONE SEEN RBC/hpf (ref 0–5)

## 2016-01-11 LAB — GLUCOSE, CAPILLARY: Glucose-Capillary: 81 mg/dL (ref 65–99)

## 2016-01-11 NOTE — MAU Note (Signed)
Feeling very weak. Was seen here yest for fever. Had flds and sent home. Last 3 wks have had headaches and severe nausea and getting worse. Denies bleeding or leaking fld. Cerix 4.5cm yesterday

## 2016-01-11 NOTE — MAU Provider Note (Signed)
History   Sonya Padilla is a 20 yo G2P1001 @ 38.4 wks in with c/o feeling weak. This has been going on most of afternoon. Denies any other complaints.  CSN: [id number]  Arrival date & time 01/11/16  1906   First Provider Initiated Contact with Patient 01/11/16 1951      Chief Complaint  Patient presents with  . Fatigue    HPI  Past Medical History  Diagnosis Date  . Obesity   . Broken leg     left  . Depression   . Suicide ([place])   . Amenorrhea 10/02/2014  . RLQ abdominal pain 10/02/2014  . Nausea 03/13/2015  . Urinary frequency 03/13/2015  . Irregular periods 05/13/2015  . Pelvic pain in female 05/13/2015  . Pregnant 05/13/2015    Past Surgical History  Procedure Laterality Date  . Esophagogastroduodenoscopy N/A 12/12/2014    [id number] reflux     Family History  Problem Relation Age of Onset  . Hypertension Mother   . Stroke Paternal Aunt   . Diabetes Maternal Grandmother   . Heart disease Maternal Grandmother   . Hyperlipidemia Maternal Grandmother   . Drug abuse Maternal Grandmother   . Glaucoma Maternal Grandmother   . Cataracts Maternal Grandmother   . Cancer Paternal Grandfather   . Psoriasis Sister   . Cervical cancer Maternal Aunt   . Physical abuse Maternal Uncle   . Prostate cancer Maternal Grandfather   . Alcohol abuse Maternal Grandfather   . Obesity Other   . Bipolar disorder Other     paternal aunt and cousin  . Drug abuse Other   . Other Father     heart issues  . Heart attack Father   . Colon cancer Paternal Grandfather     Social History  Substance Use Topics  . Smoking status: Former Smoker -- 1.00 packs/day for 2 years    Types: Cigarettes    Quit date: 11/27/2012  . Smokeless tobacco: Former Systems developer    Types: Chew     Comment: quit in 04/2015  . Alcohol Use: No    OB History    Gravida Para Term Preterm AB TAB SAB Ectopic Multiple Living   2 1 1       1       Review of Systems  Constitutional: Positive for fatigue.  HENT:  Negative.   Eyes: Negative.   Respiratory: Negative.   Cardiovascular: Negative.   Gastrointestinal: Negative.   Endocrine: Negative.   Genitourinary: Negative.   Musculoskeletal: Negative.   Skin: Negative.   Allergic/Immunologic: Negative.   Neurological: Negative.   Hematological: Negative.   Psychiatric/Behavioral: Negative.     Allergies  Tylenol  Home Medications  No current outpatient prescriptions on file.  BP 125/59 mmHg  Pulse 83  Temp(Src) 98.1 F (36.7 C)  Resp 18  Ht 5\' 5"  (1.651 m)  Wt 262 lb (118.842 kg)  BMI 43.60 kg/m2  LMP 04/09/2015 (Exact Date)  Physical Exam  Constitutional: She is oriented to person, place, and time. She appears well-developed and well-nourished.  HENT:  Head: Normocephalic.  Eyes: Pupils are equal, round, and reactive to light.  Neck: Normal range of motion.  Cardiovascular: Normal rate, regular rhythm, normal heart sounds and intact distal pulses.   Pulmonary/Chest: Effort normal and breath sounds normal.  Abdominal: Soft. Bowel sounds are normal.  Genitourinary: Vagina normal and uterus normal.  Musculoskeletal: Normal range of motion.  Neurological: She is alert and oriented to person, place, and time. She has normal  reflexes.  Skin: Skin is warm and dry.  Psychiatric: She has a normal mood and affect. Her behavior is normal. Judgment and thought content normal.    MAU Course  Procedures (including critical care time)  Labs Reviewed  GLUCOSE, CAPILLARY  URINALYSIS, ROUTINE W REFLEX MICROSCOPIC (NOT AT [facility])   No results found.   1. Supervision of normal pregnancy, third trimester       MDM  CBG 81, VSS, heart RRR, LCTAB. FHR pattern reassurring. No uc's. DX. Weakness in pregnancy Plan d/c home

## 2016-01-13 ENCOUNTER — Ambulatory Visit (INDEPENDENT_AMBULATORY_CARE_PROVIDER_SITE_OTHER): Payer: Medicaid Other | Admitting: Women's Health

## 2016-01-13 ENCOUNTER — Encounter: Payer: Self-pay | Admitting: Women's Health

## 2016-01-13 ENCOUNTER — Telehealth: Payer: Self-pay | Admitting: *Deleted

## 2016-01-13 VITALS — BP 110/80 | HR 70 | Wt 264.0 lb

## 2016-01-13 DIAGNOSIS — Z331 Pregnant state, incidental: Secondary | ICD-10-CM

## 2016-01-13 DIAGNOSIS — Z1389 Encounter for screening for other disorder: Secondary | ICD-10-CM

## 2016-01-13 DIAGNOSIS — O1213 Gestational proteinuria, third trimester: Secondary | ICD-10-CM

## 2016-01-13 DIAGNOSIS — N898 Other specified noninflammatory disorders of vagina: Secondary | ICD-10-CM | POA: Diagnosis not present

## 2016-01-13 DIAGNOSIS — R51 Headache: Secondary | ICD-10-CM

## 2016-01-13 DIAGNOSIS — O368130 Decreased fetal movements, third trimester, not applicable or unspecified: Secondary | ICD-10-CM

## 2016-01-13 DIAGNOSIS — H538 Other visual disturbances: Secondary | ICD-10-CM

## 2016-01-13 DIAGNOSIS — O26893 Other specified pregnancy related conditions, third trimester: Secondary | ICD-10-CM

## 2016-01-13 DIAGNOSIS — R519 Headache, unspecified: Secondary | ICD-10-CM

## 2016-01-13 DIAGNOSIS — R42 Dizziness and giddiness: Secondary | ICD-10-CM

## 2016-01-13 LAB — POCT URINALYSIS DIPSTICK
Glucose, UA: NEGATIVE
Ketones, UA: NEGATIVE
Leukocytes, UA: NEGATIVE
Nitrite, UA: NEGATIVE
RBC UA: NEGATIVE

## 2016-01-13 LAB — POCT WET PREP (WET MOUNT): Clue Cells Wet Prep Whiff POC: NEGATIVE

## 2016-01-13 NOTE — Telephone Encounter (Signed)
Pt c/o lightheadedness, shoulder pain, fatigue, +FM, no vaginal bleeding, green discharge. Pt given an appt today for evaluation.

## 2016-01-13 NOTE — Progress Notes (Signed)
Work-in  Low-risk OB appointment G2P1001 [redacted]w[redacted]d Estimated Date of Delivery: 01/21/16 BP 110/80 mmHg  Pulse 70  Wt 264 lb (119.75 kg)  LMP 04/09/2015 (Exact Date)  BP, weight, and urine reviewed.  Refer to obstetrical flow sheet for FH & FHR.  Reports decreased fm today.  Denies regular uc's, lof, vb, or uti s/s. Has written list w/ her of dates protein and leukocytes in urine and 'wasn't told about a uti'- have done urine cx which was neg, offered again today- pt would like to send, so urine cx sent. List of sx: lightheadedness, blurred vision/spots, shoulder pain-Rt side, cold sweats, headaches- meds not helping- has been taking husband's naproxen-apap makes her ha's worse, dizziness, hard to urinate. All sx listed began ~3wks ago per her report although she has not mentioned them, states they are getting worse. Advised no further naproxen during pregnancy. Denies ruq/epigastric pain, n/v. Went to [facility] 7/1 for 'weaknesses' told bp and blood sugar normal and d/c'd.  'Very green' mucousy d/c w/o odor/itching/irritation, wants me to check it out again. Wants membranes stripped- not 39wks yet.  NST: reactive, Cat 1 UCs: irregular and not perceived by pt Spec exam: cx visually 2cm, light yellow mucousy d/c, no pooling, no odor, wet prep neg SVE: 5/70/ballotable, vtx DTRs 2+, no clonus, 1+ BLE edema BP normal, don't feel sx are pre-e related, but will check labs today Reviewed labor s/s, fkc. Gave printed info on headache prevention/relief measures Plan:  Continue routine obstetrical care  F/U in 1wk for OB appointment

## 2016-01-13 NOTE — Patient Instructions (Signed)
For Headaches:   Stay well hydrated, drink enough water so that your urine is clear, sometimes if you are dehydrated you can get headaches  Eat small frequent meals and snacks, sometimes if you are hungry you can get headaches  Sometimes you get headaches during pregnancy from the pregnancy hormones  You can try tylenol (1-2 regular strength 325mg  or 1-2 extra strength 500mg ) as directed on the box. The least amount of medication that works is best.   Cool compresses (cool wet washcloth or ice pack) to area of head that is hurting  You can also try drinking a caffeinated drink to see if this will help  If not helping, try below:  For Prevention of Headaches/Migraines:  CoQ10 100mg  three times daily  Vitamin B2 400mg  daily  Magnesium Oxide 400-600mg  daily  If You Get a Bad Headache/Migraine:  Benadryl 25mg    Magnesium Oxide  1 large Gatorade  2 extra strength Tylenol (1,000mg  total)  1 cup coffee or Coke  If this doesn't help please call us @ [phone]   Call the office [phone]) or go to [facility] if:  You begin to have strong, frequent contractions  Your water breaks.  Sometimes it is a big gush of fluid, sometimes it is just a trickle that keeps getting your panties wet or running down your legs  You have vaginal bleeding.  It is normal to have a small amount of spotting if your cervix was checked.   You don't feel your baby moving like normal.  If you don't, get you something to eat and drink and lay down and focus on feeling your baby move.  You should feel at least 10 movements in 2 hours.  If you don't, you should call the office or go to [facility] Contractions Contractions of the uterus can occur throughout pregnancy. Contractions are not always a sign that you are in labor.  WHAT ARE BRAXTON HICKS CONTRACTIONS?  Contractions that occur before labor are called Braxton Hicks contractions, or false labor. Toward the end of  pregnancy (32-34 weeks), these contractions can develop more often and may become more forceful. This is not true labor because these contractions do not result in opening (dilatation) and thinning of the cervix. They are sometimes difficult to tell apart from true labor because these contractions can be forceful and people have different pain tolerances. You should not feel embarrassed if you go to the hospital with false labor. Sometimes, the only way to tell if you are in true labor is for your health care provider to look for changes in the cervix. If there are no prenatal problems or other health problems associated with the pregnancy, it is completely safe to be sent home with false labor and await the onset of true labor. HOW CAN YOU TELL THE DIFFERENCE BETWEEN TRUE AND FALSE LABOR? False Labor  The contractions of false labor are usually shorter and not as hard as those of true labor.   The contractions are usually irregular.   The contractions are often felt in the front of the lower abdomen and in the groin.   The contractions may go away when you walk around or change positions while lying down.   The contractions get weaker and are shorter lasting as time goes on.   The contractions do not usually become progressively stronger, regular, and closer together as with true labor.  True Labor  Contractions in true labor last 30-70 seconds, become  very regular, usually become more intense, and increase in frequency.   The contractions do not go away with walking.   The discomfort is usually felt in the top of the uterus and spreads to the lower abdomen and low back.   True labor can be determined by your health care provider with an exam. This will show that the cervix is dilating and getting thinner.  WHAT TO REMEMBER  Keep up with your usual exercises and follow other instructions given by your health care provider.   Take medicines as directed by your health care  provider.   Keep your regular prenatal appointments.   Eat and drink lightly if you think you are going into labor.   If Braxton Hicks contractions are making you uncomfortable:   Change your position from lying down or resting to walking, or from walking to resting.   Sit and rest in a tub of warm water.   Drink 2-3 glasses of water. Dehydration may cause these contractions.   Do slow and deep breathing several times an hour.  WHEN SHOULD I SEEK IMMEDIATE MEDICAL CARE? Seek immediate medical care if:  Your contractions become stronger, more regular, and closer together.   You have fluid leaking or gushing from your vagina.   You have a fever.   You pass blood-tinged mucus.   You have vaginal bleeding.   You have continuous abdominal pain.   You have low back pain that you never had before.   You feel your baby's head pushing down and causing pelvic pressure.   Your baby is not moving as much as it used to.    This information is not intended to replace advice given to you by your health care provider. Make sure you discuss any questions you have with your health care provider.   Document Released: 06/29/2005 Document Revised: 07/04/2013 Document Reviewed: 04/10/2013 Elsevier Interactive Patient Education [employer].

## 2016-01-14 LAB — COMPREHENSIVE METABOLIC PANEL
ALT: 13 IU/L (ref 0–32)
AST: 15 IU/L (ref 0–40)
Albumin/Globulin Ratio: 1.4 (ref 1.2–2.2)
Albumin: 3.2 g/dL — ABNORMAL LOW (ref 3.5–5.5)
Alkaline Phosphatase: 164 IU/L — ABNORMAL HIGH (ref 39–117)
BUN/Creatinine Ratio: 13 (ref 9–23)
BUN: 8 mg/dL (ref 6–20)
Bilirubin Total: 0.3 mg/dL (ref 0.0–1.2)
CALCIUM: 8.9 mg/dL (ref 8.7–10.2)
CO2: 19 mmol/L (ref 18–29)
CREATININE: 0.61 mg/dL (ref 0.57–1.00)
Chloride: 106 mmol/L (ref 96–106)
GFR calc Af Amer: 152 mL/min/{1.73_m2} (ref >59)
GFR, EST NON AFRICAN AMERICAN: 132 mL/min/{1.73_m2} (ref >59)
GLOBULIN, TOTAL: 2.3 g/dL (ref 1.5–4.5)
Glucose: 88 mg/dL (ref 65–99)
Potassium: 4.3 mmol/L (ref 3.5–5.2)
SODIUM: 140 mmol/L (ref 134–144)
Total Protein: 5.5 g/dL — ABNORMAL LOW (ref 6.0–8.5)

## 2016-01-14 LAB — CBC
HEMATOCRIT: 34 % (ref 34.0–46.6)
HEMOGLOBIN: 11.2 g/dL (ref 11.1–15.9)
MCH: 27.5 pg (ref 26.6–33.0)
MCHC: 32.9 g/dL (ref 31.5–35.7)
MCV: 83 fL (ref 79–97)
Platelets: 173 10*3/uL (ref 150–379)
RBC: 4.08 x10E6/uL (ref 3.77–5.28)
RDW: 16.2 % — AB (ref 12.3–15.4)
WBC: 10.3 10*3/uL (ref 3.4–10.8)

## 2016-01-14 LAB — PROTEIN / CREATININE RATIO, URINE
Creatinine, Urine: 332.8 mg/dL
PROTEIN UR: 61.3 mg/dL
PROTEIN/CREAT RATIO: 184 mg/g{creat} (ref 0–200)

## 2016-01-15 ENCOUNTER — Encounter: Payer: Medicaid Other | Admitting: Women's Health

## 2016-01-15 ENCOUNTER — Telehealth: Payer: Self-pay | Admitting: *Deleted

## 2016-01-15 LAB — URINE CULTURE

## 2016-01-15 NOTE — Telephone Encounter (Addendum)
Pt requesting lab results from 01/13/2016 all normal per Knute Neu, CNM.

## 2016-01-17 ENCOUNTER — Telehealth: Payer: Self-pay | Admitting: *Deleted

## 2016-01-17 NOTE — Telephone Encounter (Signed)
Pt c/o contractions only when she coughs, did have contractions that were more frequent last night. Pt advised to continue to monitor and advised for contractions 5-10 minutes apart or gush of fluids to go to [facility].   Pt states what can she take for the cough. Pt advised can take OTC Robitussin or Sudafed. Pt verbalized understanding.

## 2016-01-22 ENCOUNTER — Inpatient Hospital Stay (HOSPITAL_COMMUNITY)
Admission: AD | Admit: 2016-01-22 | Discharge: 2016-01-24 | DRG: 774 | Disposition: A | Discharge status: Home / Self Care | Payer: Medicaid Other | Source: Ambulatory Visit | Attending: Obstetrics & Gynecology | Admitting: Obstetrics & Gynecology

## 2016-01-22 ENCOUNTER — Encounter: Payer: Self-pay | Admitting: Women's Health

## 2016-01-22 ENCOUNTER — Encounter (HOSPITAL_COMMUNITY): Payer: Self-pay

## 2016-01-22 ENCOUNTER — Encounter: Payer: Medicaid Other | Admitting: Women's Health

## 2016-01-22 ENCOUNTER — Ambulatory Visit (INDEPENDENT_AMBULATORY_CARE_PROVIDER_SITE_OTHER): Payer: Medicaid Other | Admitting: Women's Health

## 2016-01-22 VITALS — BP 110/60 | HR 76 | Wt 262.0 lb

## 2016-01-22 DIAGNOSIS — Z3A40 40 weeks gestation of pregnancy: Secondary | ICD-10-CM

## 2016-01-22 DIAGNOSIS — Z800 Family history of malignant neoplasm of digestive organs: Secondary | ICD-10-CM

## 2016-01-22 DIAGNOSIS — Z8049 Family history of malignant neoplasm of other genital organs: Secondary | ICD-10-CM

## 2016-01-22 DIAGNOSIS — O9081 Anemia of the puerperium: Secondary | ICD-10-CM | POA: Diagnosis not present

## 2016-01-22 DIAGNOSIS — Z8249 Family history of ischemic heart disease and other diseases of the circulatory system: Secondary | ICD-10-CM

## 2016-01-22 DIAGNOSIS — O26893 Other specified pregnancy related conditions, third trimester: Secondary | ICD-10-CM | POA: Diagnosis present

## 2016-01-22 DIAGNOSIS — Z87891 Personal history of nicotine dependence: Secondary | ICD-10-CM | POA: Diagnosis not present

## 2016-01-22 DIAGNOSIS — Z3493 Encounter for supervision of normal pregnancy, unspecified, third trimester: Secondary | ICD-10-CM

## 2016-01-22 DIAGNOSIS — D649 Anemia, unspecified: Secondary | ICD-10-CM | POA: Diagnosis not present

## 2016-01-22 DIAGNOSIS — O99324 Drug use complicating childbirth: Secondary | ICD-10-CM | POA: Diagnosis present

## 2016-01-22 DIAGNOSIS — Z8042 Family history of malignant neoplasm of prostate: Secondary | ICD-10-CM | POA: Diagnosis not present

## 2016-01-22 DIAGNOSIS — E669 Obesity, unspecified: Secondary | ICD-10-CM | POA: Diagnosis not present

## 2016-01-22 DIAGNOSIS — Z1389 Encounter for screening for other disorder: Secondary | ICD-10-CM

## 2016-01-22 DIAGNOSIS — Z3483 Encounter for supervision of other normal pregnancy, third trimester: Secondary | ICD-10-CM

## 2016-01-22 DIAGNOSIS — O99344 Other mental disorders complicating childbirth: Secondary | ICD-10-CM | POA: Diagnosis present

## 2016-01-22 DIAGNOSIS — O99214 Obesity complicating childbirth: Secondary | ICD-10-CM | POA: Diagnosis not present

## 2016-01-22 DIAGNOSIS — F329 Major depressive disorder, single episode, unspecified: Secondary | ICD-10-CM | POA: Diagnosis present

## 2016-01-22 DIAGNOSIS — Z823 Family history of stroke: Secondary | ICD-10-CM

## 2016-01-22 DIAGNOSIS — Z6791 Unspecified blood type, Rh negative: Secondary | ICD-10-CM

## 2016-01-22 DIAGNOSIS — IMO0001 Reserved for inherently not codable concepts without codable children: Secondary | ICD-10-CM

## 2016-01-22 DIAGNOSIS — Z833 Family history of diabetes mellitus: Secondary | ICD-10-CM | POA: Diagnosis not present

## 2016-01-22 DIAGNOSIS — Z331 Pregnant state, incidental: Secondary | ICD-10-CM

## 2016-01-22 DIAGNOSIS — F1290 Cannabis use, unspecified, uncomplicated: Secondary | ICD-10-CM | POA: Diagnosis present

## 2016-01-22 LAB — CBC
HCT: 32.8 % — ABNORMAL LOW (ref 36.0–46.0)
Hemoglobin: 10.6 g/dL — ABNORMAL LOW (ref 12.0–15.0)
MCH: 27.2 pg (ref 26.0–34.0)
MCHC: 32.3 g/dL (ref 30.0–36.0)
MCV: 84.1 fL (ref 78.0–100.0)
PLATELETS: 181 10*3/uL (ref 150–400)
RBC: 3.9 MIL/uL (ref 3.87–5.11)
RDW: 16.5 % — ABNORMAL HIGH (ref 11.5–15.5)
WBC: 11 10*3/uL — AB (ref 4.0–10.5)

## 2016-01-22 LAB — POCT URINALYSIS DIPSTICK
Blood, UA: NEGATIVE
Glucose, UA: NEGATIVE
Ketones, UA: NEGATIVE
LEUKOCYTES UA: NEGATIVE
Nitrite, UA: NEGATIVE
PROTEIN UA: NEGATIVE

## 2016-01-22 MED ORDER — LACTATED RINGERS IV SOLN: 500.0000 mL | INTRAVENOUS | Status: DC | PRN

## 2016-01-22 MED ORDER — PHENYLEPHRINE 40 MCG/ML (10ML) SYRINGE FOR IV PUSH (FOR BLOOD PRESSURE SUPPORT)
80.0000 ug | PREFILLED_SYRINGE | INTRAVENOUS | Status: DC | PRN
  Filled 2016-01-22: qty 10
  Filled 2016-01-22: qty 5

## 2016-01-22 MED ORDER — FENTANYL CITRATE (PF) 100 MCG/2ML IJ SOLN
100.0000 ug | INTRAMUSCULAR | Status: DC | PRN
  Administered 2016-01-22 (×2): 100 ug via INTRAVENOUS
  Filled 2016-01-22 (×2): qty 2

## 2016-01-22 MED ORDER — LACTATED RINGERS IV SOLN
INTRAVENOUS | Status: DC
  Administered 2016-01-22 (×2): via INTRAVENOUS

## 2016-01-22 MED ORDER — LIDOCAINE HCL (PF) 1 % IJ SOLN
30.0000 mL | INTRAMUSCULAR | Status: DC | PRN
  Administered 2016-01-23: 30 mL via SUBCUTANEOUS
  Filled 2016-01-22 (×2): qty 30

## 2016-01-22 MED ORDER — OXYTOCIN BOLUS FROM INFUSION
500.0000 mL | INTRAVENOUS | Status: DC
  Administered 2016-01-23: 500 mL via INTRAVENOUS
  Administered 2016-01-23: 500 mL/h via INTRAVENOUS

## 2016-01-22 MED ORDER — EPHEDRINE 5 MG/ML INJ
10.0000 mg | INTRAVENOUS | Status: DC | PRN
  Filled 2016-01-22: qty 2

## 2016-01-22 MED ORDER — ONDANSETRON HCL 4 MG/2ML IJ SOLN: 4.0000 mg | Freq: Four times a day (QID) | INTRAMUSCULAR | Status: DC | PRN

## 2016-01-22 MED ORDER — SOD CITRATE-CITRIC ACID 500-334 MG/5ML PO SOLN: 30.0000 mL | ORAL | Status: DC | PRN

## 2016-01-22 MED ORDER — OXYTOCIN 40 UNITS IN LACTATED RINGERS INFUSION - SIMPLE MED
2.5000 [IU]/h | INTRAVENOUS | Status: DC
  Filled 2016-01-22: qty 1000

## 2016-01-22 MED ORDER — LACTATED RINGERS IV SOLN: 500.0000 mL | Freq: Once | INTRAVENOUS | Status: DC

## 2016-01-22 NOTE — Patient Instructions (Signed)
Your induction is scheduled for 7/17 @ 11:45pm. Go to [facility] hospital, Maternity Admissions Unit (Emergency) entrance and let them know you are there to be induced. They will send someone from Labor & Delivery to come get you.    Call the office [phone]) or go to [facility] if:  You begin to have strong, frequent contractions  Your water breaks.  Sometimes it is a big gush of fluid, sometimes it is just a trickle that keeps getting your panties wet or running down your legs  You have vaginal bleeding.  It is normal to have a small amount of spotting if your cervix was checked.   You don't feel your baby moving like normal.  If you don't, get you something to eat and drink and lay down and focus on feeling your baby move.  You should feel at least 10 movements in 2 hours.  If you don't, you should call the office or go to [place] Contractions Contractions of the uterus can occur throughout pregnancy. Contractions are not always a sign that you are in labor.  WHAT ARE BRAXTON HICKS CONTRACTIONS?  Contractions that occur before labor are called Braxton Hicks contractions, or false labor. Toward the end of pregnancy (32-34 weeks), these contractions can develop more often and may become more forceful. This is not true labor because these contractions do not result in opening (dilatation) and thinning of the cervix. They are sometimes difficult to tell apart from true labor because these contractions can be forceful and people have different pain tolerances. You should not feel embarrassed if you go to the hospital with false labor. Sometimes, the only way to tell if you are in true labor is for your health care provider to look for changes in the cervix. If there are no prenatal problems or other health problems associated with the pregnancy, it is completely safe to be sent home with false labor and await the onset of true labor. HOW CAN YOU TELL THE DIFFERENCE  BETWEEN TRUE AND FALSE LABOR? False Labor  The contractions of false labor are usually shorter and not as hard as those of true labor.   The contractions are usually irregular.   The contractions are often felt in the front of the lower abdomen and in the groin.   The contractions may go away when you walk around or change positions while lying down.   The contractions get weaker and are shorter lasting as time goes on.   The contractions do not usually become progressively stronger, regular, and closer together as with true labor.  True Labor  Contractions in true labor last 30-70 seconds, become very regular, usually become more intense, and increase in frequency.   The contractions do not go away with walking.   The discomfort is usually felt in the top of the uterus and spreads to the lower abdomen and low back.   True labor can be determined by your health care provider with an exam. This will show that the cervix is dilating and getting thinner.  WHAT TO REMEMBER  Keep up with your usual exercises and follow other instructions given by your health care provider.   Take medicines as directed by your health care provider.   Keep your regular prenatal appointments.   Eat and drink lightly if you think you are going into labor.   If Braxton Hicks contractions are making you uncomfortable:   Change your position from lying down or resting  to walking, or from walking to resting.   Sit and rest in a tub of warm water.   Drink 2-3 glasses of water. Dehydration may cause these contractions.   Do slow and deep breathing several times an hour.  WHEN SHOULD I SEEK IMMEDIATE MEDICAL CARE? Seek immediate medical care if:  Your contractions become stronger, more regular, and closer together.   You have fluid leaking or gushing from your vagina.   You have a fever.   You pass blood-tinged mucus.   You have vaginal bleeding.   You have continuous  abdominal pain.   You have low back pain that you never had before.   You feel your baby's head pushing down and causing pelvic pressure.   Your baby is not moving as much as it used to.    This information is not intended to replace advice given to you by your health care provider. Make sure you discuss any questions you have with your health care provider.   Document Released: 06/29/2005 Document Revised: 07/04/2013 Document Reviewed: 04/10/2013 Elsevier Interactive Patient Education [employer].

## 2016-01-22 NOTE — H&P (Signed)
Sonya Padilla is a 20 y.o. female G2P1001 @ 40.1wks by 6wk scan presenting for reg ctx. Denies leaking or bldg. 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 Obesity   . Broken leg     left  . Depression   . Suicide ([place])   . Amenorrhea 10/02/2014  . RLQ abdominal pain 10/02/2014  . Nausea 03/13/2015  . Urinary frequency 03/13/2015  . Irregular periods 05/13/2015  . Pelvic pain in female 05/13/2015  . Pregnant 05/13/2015   Past Surgical History  Procedure Laterality Date  . Esophagogastroduodenoscopy N/A 12/12/2014    [id number] reflux    Family History: family history includes Alcohol abuse in her maternal grandfather; Bipolar disorder in her other; Cancer in her paternal grandfather; Cataracts in her maternal grandmother; Cervical cancer in her maternal aunt; Colon cancer in her paternal grandfather; Diabetes in her maternal grandmother; Drug abuse in her maternal grandmother and other; Glaucoma in her maternal grandmother; Heart attack in her father; Heart disease in her maternal grandmother; Hyperlipidemia in her maternal grandmother; Hypertension in her mother; Obesity in her other; Other in her father; Physical abuse in her maternal uncle; Prostate cancer in her maternal grandfather; Psoriasis in her sister; Stroke in her paternal aunt. Social History:  reports that she quit smoking about 3 years ago. Her smoking use included Cigarettes. She has a 2 pack-year smoking history. She has quit using smokeless tobacco. Her smokeless tobacco use included Chew. She reports that she does not drink alcohol or use illicit drugs.   Prenatal  Transfer Tool  Maternal Diabetes: No Genetic Screening: Normal Maternal Ultrasounds/Referrals: Normal Fetal Ultrasounds or other Referrals:  None Maternal Substance Abuse:  Yes:  Type: Smoker, Marijuana - in early preg Significant Maternal Medications:  Meds include: Other:  Lamictal in early preg Significant Maternal Lab Results:  Lab values include: Group B Strep negative Other Comments:  FOB w/ bicuspid aortic valve- did not keep fetal echo appt  ROS  Dilation: 6.5 Effacement (%): 90 Station: -2 Exam by:: Glenford Peers RNC Last menstrual period 04/09/2015, not currently breastfeeding. Exam Physical Exam  Constitutional: She is oriented to person, place, and time. She appears well-developed.  HENT:  Head: Normocephalic.  Neck: Normal range of motion.  Cardiovascular: Normal rate.   Respiratory: Effort normal.  GI:  EFM 130s, +accels, no decels Ctx q 3-6 mins  Musculoskeletal: Normal range of motion.  Neurological: She is alert and oriented to person, place, and time.  Skin: Skin is warm and dry.  Psychiatric: She has a normal mood and affect. Her behavior is normal. Thought content normal.    Prenatal labs: ABO, Rh: O/Negative/-- (11/30 1541) Antibody: Negative (04/19 0903) Rubella: 4.58 (11/30 1541) RPR: Non Reactive (04/19 0903)  HBsAg: Negative (11/30 1541)  HIV: Non Reactive (04/19 0903)  GBS: Negative (06/21 1645)   Assessment/Plan: IUP@term  Early labor GBS neg  Admit to [facility] Expectant management Anticipate SVD   Serita Grammes CNM 01/22/2016, 10:16 PM

## 2016-01-22 NOTE — Anesthesia Preprocedure Evaluation (Addendum)
Anesthesia Evaluation  Patient identified by MRN, date of birth, ID band Patient awake    Reviewed: Allergy & Precautions, NPO status , Patient's Chart, lab work & pertinent test results  Airway Mallampati: III  TM Distance: >3 FB Neck ROM: Full    Dental  (+) Teeth Intact, Dental Advisory Given   Pulmonary former smoker,    breath sounds clear to auscultation       Cardiovascular negative cardio ROS   Rhythm:Regular Rate:Normal     Neuro/Psych  Headaches, PSYCHIATRIC DISORDERS Depression    GI/Hepatic Neg liver ROS, PUD,   Endo/Other  negative endocrine ROS  Renal/GU negative Renal ROS  negative genitourinary   Musculoskeletal negative musculoskeletal ROS (+)   Abdominal   Peds negative pediatric ROS (+)  Hematology negative hematology ROS (+)   Anesthesia Other Findings   Reproductive/Obstetrics (+) Pregnancy                            Lab Results  Component Value Date   WBC 11.0* 01/22/2016   HGB 10.6* 01/22/2016   HCT 32.8* 01/22/2016   MCV 84.1 01/22/2016   PLT 181 01/22/2016   No results found for: INR, PROTIME   Anesthesia Physical Anesthesia Plan  ASA: III  Anesthesia Plan: Epidural   Post-op Pain Management:    Induction:   Airway Management Planned:   Additional Equipment:   Intra-op Plan:   Post-operative Plan:   Informed Consent: I have reviewed the patients History and Physical, chart, labs and discussed the procedure including the risks, benefits and alternatives for the proposed anesthesia with the patient or authorized representative who has indicated his/her understanding and acceptance.     Plan Discussed with:   Anesthesia Plan Comments:         Anesthesia Quick Evaluation

## 2016-01-22 NOTE — Progress Notes (Signed)
Notified of pt arrival in MAU and exam with contraction pattern. Will put in orders to admit to labor and delivery with pain medication

## 2016-01-22 NOTE — MAU Note (Signed)
Pt presents complaining of contractions every 2-3 minutes since 1730. 6cm in office today and had membranes swept. Some vaginal bleeding, no leaking. Reports good fetal movement.

## 2016-01-22 NOTE — Progress Notes (Signed)
Low-risk OB appointment G2P1001 [redacted]w[redacted]d Estimated Date of Delivery: 01/21/16 BP 110/60 mmHg  Pulse 76  Wt 262 lb (118.842 kg)  LMP 04/09/2015 (Exact Date)  BP, weight, and urine reviewed.  Refer to obstetrical flow sheet for FH & FHR.  Reports good fm.  Denies regular uc's, lof, vb, or uti s/s. No complaints. SVE per request: 6/80/-2, vtx  Offered membrane sweeping, discussed r/b- pt decided to proceed, so membranes swept. Reviewed labor s/s, fkc Plan:  Continue routine obstetrical care, IOL 7/17 @ 2345 for postdates if needed  F/U in 4-6wks for pp visit Order nexplanon today

## 2016-01-23 ENCOUNTER — Inpatient Hospital Stay (HOSPITAL_COMMUNITY): Payer: Medicaid Other | Admitting: Anesthesiology

## 2016-01-23 ENCOUNTER — Encounter (HOSPITAL_COMMUNITY): Payer: Self-pay

## 2016-01-23 DIAGNOSIS — O99214 Obesity complicating childbirth: Secondary | ICD-10-CM

## 2016-01-23 DIAGNOSIS — F329 Major depressive disorder, single episode, unspecified: Secondary | ICD-10-CM

## 2016-01-23 DIAGNOSIS — E669 Obesity, unspecified: Secondary | ICD-10-CM

## 2016-01-23 DIAGNOSIS — O99344 Other mental disorders complicating childbirth: Secondary | ICD-10-CM

## 2016-01-23 DIAGNOSIS — Z3A40 40 weeks gestation of pregnancy: Secondary | ICD-10-CM

## 2016-01-23 LAB — RPR: RPR Ser Ql: NONREACTIVE

## 2016-01-23 MED ORDER — FENTANYL 2.5 MCG/ML BUPIVACAINE 1/10 % EPIDURAL INFUSION (WH - ANES)
14.0000 mL/h | INTRAMUSCULAR | Status: DC | PRN
  Administered 2016-01-23: 14 mL/h via EPIDURAL
  Filled 2016-01-23: qty 125

## 2016-01-23 MED ORDER — PHENYLEPHRINE 40 MCG/ML (10ML) SYRINGE FOR IV PUSH (FOR BLOOD PRESSURE SUPPORT)
80.0000 ug | PREFILLED_SYRINGE | INTRAVENOUS | Status: DC | PRN
  Filled 2016-01-23: qty 5

## 2016-01-23 MED ORDER — DIBUCAINE 1 % RE OINT: 1.0000 "application " | TOPICAL_OINTMENT | RECTAL | Status: DC | PRN

## 2016-01-23 MED ORDER — DIPHENHYDRAMINE HCL 25 MG PO CAPS: 25.0000 mg | ORAL_CAPSULE | Freq: Four times a day (QID) | ORAL | Status: DC | PRN

## 2016-01-23 MED ORDER — SIMETHICONE 80 MG PO CHEW: 80.0000 mg | CHEWABLE_TABLET | ORAL | Status: DC | PRN

## 2016-01-23 MED ORDER — PRENATAL MULTIVITAMIN CH
1.0000 | ORAL_TABLET | Freq: Every day | ORAL | Status: DC
  Administered 2016-01-24: 1 via ORAL
  Filled 2016-01-23: qty 1

## 2016-01-23 MED ORDER — COCONUT OIL OIL: 1.0000 "application " | TOPICAL_OIL | Status: DC | PRN

## 2016-01-23 MED ORDER — LACTATED RINGERS IV SOLN: 500.0000 mL | Freq: Once | INTRAVENOUS | Status: DC

## 2016-01-23 MED ORDER — DIPHENHYDRAMINE HCL 50 MG/ML IJ SOLN: 12.5000 mg | INTRAMUSCULAR | Status: DC | PRN

## 2016-01-23 MED ORDER — ONDANSETRON HCL 4 MG PO TABS: 4.0000 mg | ORAL_TABLET | ORAL | Status: DC | PRN

## 2016-01-23 MED ORDER — LOPERAMIDE HCL 2 MG PO CAPS
2.0000 mg | ORAL_CAPSULE | ORAL | Status: DC | PRN
  Administered 2016-01-23: 2 mg via ORAL
  Filled 2016-01-23 (×3): qty 1

## 2016-01-23 MED ORDER — EPHEDRINE 5 MG/ML INJ
10.0000 mg | INTRAVENOUS | Status: DC | PRN
  Filled 2016-01-23: qty 2

## 2016-01-23 MED ORDER — SENNOSIDES-DOCUSATE SODIUM 8.6-50 MG PO TABS
2.0000 | ORAL_TABLET | ORAL | Status: DC
  Administered 2016-01-23: 2 via ORAL
  Filled 2016-01-23: qty 2

## 2016-01-23 MED ORDER — CARBOPROST TROMETHAMINE 250 MCG/ML IM SOLN
1000.0000 ug | Freq: Once | INTRAMUSCULAR | Status: AC
  Administered 2016-01-23: 1000 ug via INTRAMUSCULAR

## 2016-01-23 MED ORDER — LIDOCAINE HCL (PF) 1 % IJ SOLN
INTRAMUSCULAR | Status: DC | PRN
  Administered 2016-01-23 (×2): 4 mL via EPIDURAL

## 2016-01-23 MED ORDER — TETANUS-DIPHTH-ACELL PERTUSSIS 5-2.5-18.5 LF-MCG/0.5 IM SUSP: 0.5000 mL | Freq: Once | INTRAMUSCULAR | Status: DC

## 2016-01-23 MED ORDER — ONDANSETRON HCL 4 MG/2ML IJ SOLN: 4.0000 mg | INTRAMUSCULAR | Status: DC | PRN

## 2016-01-23 MED ORDER — IBUPROFEN 600 MG PO TABS
600.0000 mg | ORAL_TABLET | Freq: Four times a day (QID) | ORAL | Status: DC
  Administered 2016-01-23 – 2016-01-24 (×5): 600 mg via ORAL
  Filled 2016-01-23 (×5): qty 1

## 2016-01-23 MED ORDER — BENZOCAINE-MENTHOL 20-0.5 % EX AERO
1.0000 "application " | INHALATION_SPRAY | CUTANEOUS | Status: DC | PRN
  Administered 2016-01-23: 1 via TOPICAL
  Filled 2016-01-23: qty 56

## 2016-01-23 MED ORDER — METHYLERGONOVINE MALEATE 0.2 MG/ML IJ SOLN
INTRAMUSCULAR | Status: AC
  Filled 2016-01-23: qty 1

## 2016-01-23 MED ORDER — CARBOPROST TROMETHAMINE 250 MCG/ML IM SOLN: 250.0000 ug | Freq: Once | INTRAMUSCULAR | Status: DC

## 2016-01-23 MED ORDER — ZOLPIDEM TARTRATE 5 MG PO TABS: 5.0000 mg | ORAL_TABLET | Freq: Every evening | ORAL | Status: DC | PRN

## 2016-01-23 MED ORDER — MISOPROSTOL 200 MCG PO TABS
ORAL_TABLET | ORAL | Status: AC
  Filled 2016-01-23: qty 4

## 2016-01-23 MED ORDER — MISOPROSTOL 200 MCG PO TABS
800.0000 ug | ORAL_TABLET | Freq: Once | ORAL | Status: AC
  Administered 2016-01-23: 800 ug via ORAL

## 2016-01-23 MED ORDER — METHYLERGONOVINE MALEATE 0.2 MG/ML IJ SOLN
0.2000 mg | Freq: Once | INTRAMUSCULAR | Status: AC
  Administered 2016-01-23: 0.2 mg via INTRAMUSCULAR

## 2016-01-23 MED ORDER — WITCH HAZEL-GLYCERIN EX PADS: 1.0000 "application " | MEDICATED_PAD | CUTANEOUS | Status: DC | PRN

## 2016-01-23 NOTE — Progress Notes (Signed)
Patient ID: [name], female   DOB: 1996/04/03, 20 y.o.   MRN: [id number]  Has rec'd Fent x 2; would like to avoid an epidural; requesting AROM  VSS, afeb FHR 130s, +accels, no decels Ctx q 3-6 mins Cx 6+/70/-2/post; AROM for thin MSF  IUP@term  Early active labor  Check cx in 2 hrs or sooner prn Anticipate SVD  Serita Grammes  CNM  01/23/2016 1:29 AM

## 2016-01-23 NOTE — Anesthesia Postprocedure Evaluation (Signed)
Anesthesia Post Note  Patient: ANNAKATE FASBENDER  Procedure(s) Performed: * No procedures listed *  Patient location during evaluation: Mother Baby Anesthesia Type: Epidural Level of consciousness: awake and alert and oriented Pain management: pain level controlled Vital Signs Assessment: post-procedure vital signs reviewed and stable Respiratory status: spontaneous breathing and nonlabored ventilation Cardiovascular status: stable Postop Assessment: no headache, no backache, patient able to bend at knees, no signs of nausea or vomiting and adequate PO intake Anesthetic complications: no     Last Vitals:  Filed Vitals:   01/23/16 0715 01/23/16 0845  BP: 128/59 121/67  Pulse: 59 83  Temp: 36.6 C 36.8 C  Resp: 18 18    Last Pain:  Filed Vitals:   01/23/16 0953  PainSc: 3    Pain Goal:                 Willa Rough

## 2016-01-23 NOTE — Anesthesia Procedure Notes (Signed)
Epidural Patient location during procedure: OB Start time: 01/23/2016 3:13 AM End time: 01/23/2016 3:19 AM  Staffing Anesthesiologist: Suella Broad D Performed by: anesthesiologist   Preanesthetic Checklist Completed: patient identified, site marked, surgical consent, pre-op evaluation, timeout performed, IV checked, risks and benefits discussed and monitors and equipment checked  Epidural Patient position: sitting Prep: ChloraPrep Patient monitoring: heart rate, continuous pulse ox and blood pressure Approach: midline Location: L3-L4 Injection technique: LOR saline  Needle:  Needle type: Tuohy  Needle gauge: 17 G Needle length: 9 cm Catheter type: closed end flexible Catheter size: 20 Guage Test dose: negative and 1.5% lidocaine  Assessment Events: blood not aspirated, injection not painful, no injection resistance and no paresthesia  Additional Notes LOR @ 8  Patient identified. Risks/Benefits/Options discussed with patient including but not limited to bleeding, infection, nerve damage, paralysis, failed block, incomplete pain control, headache, blood pressure changes, nausea, vomiting, reactions to medications, itching and postpartum back pain. Confirmed with bedside nurse the patient's most recent platelet count. Confirmed with patient that they are not currently taking any anticoagulation, have any bleeding history or any family history of bleeding disorders. Patient expressed understanding and wished to proceed. All questions were answered. Sterile technique was used throughout the entire procedure. Please see nursing notes for vital signs. Test dose was given through epidural catheter and negative prior to continuing to dose epidural or start infusion. Warning signs of high block given to the patient including shortness of breath, tingling/numbness in hands, complete motor block, or any concerning symptoms with instructions to call for help. Patient was given instructions on  fall risk and not to get out of bed. All questions and concerns addressed with instructions to call with any issues or inadequate analgesia.    Reason for block:procedure for pain

## 2016-01-23 NOTE — Progress Notes (Signed)
UR chart review completed.  

## 2016-01-23 NOTE — Anesthesia Pain Management Evaluation Note (Signed)
  CRNA Pain Management Visit Note  Patient: Sonya Padilla, 20 y.o., female  "Hello I am a member of the anesthesia team at [facility]. We have an anesthesia team available at all times to provide care throughout the hospital, including epidural management and anesthesia for C-section. I don't know your plan for the delivery whether it a natural birth, water birth, IV sedation, nitrous supplementation, doula or epidural, but we want to meet your pain goals."   1.Was your pain managed to your expectations on prior hospitalizations?   Yes   2.What is your expectation for pain management during this hospitalization?     Nitrous Oxide  3.How can we help you reach that goal? N2O  Record the patient's initial score and the patient's pain goal.   Pain: 10  Pain Goal: 10 [facility] wants you to be able to say your pain was always managed very well.  Esmond Hinch 01/23/2016

## 2016-01-23 NOTE — Progress Notes (Signed)
Fentanyl not working great for patient. At this time does not want gas or epidural. Discussed AROM with CNM since patient still at 6.5 cm with last check.

## 2016-01-23 NOTE — Anesthesia Postprocedure Evaluation (Signed)
Anesthesia Post Note  Patient: Sonya Padilla  Procedure(s) Performed: * No procedures listed *  Anesthesia Post Evaluation   Last Vitals:  Filed Vitals:   01/23/16 0715 01/23/16 0845  BP: 128/59 121/67  Pulse: 59 83  Temp: 36.6 C 36.8 C  Resp: 18 18    Last Pain:  Filed Vitals:   01/23/16 0953  PainSc: 3    Pain Goal:                 Willa Rough

## 2016-01-24 DIAGNOSIS — O9081 Anemia of the puerperium: Secondary | ICD-10-CM

## 2016-01-24 DIAGNOSIS — O99324 Drug use complicating childbirth: Secondary | ICD-10-CM

## 2016-01-24 DIAGNOSIS — Z87891 Personal history of nicotine dependence: Secondary | ICD-10-CM

## 2016-01-24 DIAGNOSIS — O26893 Other specified pregnancy related conditions, third trimester: Secondary | ICD-10-CM

## 2016-01-24 DIAGNOSIS — O99344 Other mental disorders complicating childbirth: Secondary | ICD-10-CM

## 2016-01-24 DIAGNOSIS — Z3A40 40 weeks gestation of pregnancy: Secondary | ICD-10-CM

## 2016-01-24 DIAGNOSIS — Z6791 Unspecified blood type, Rh negative: Secondary | ICD-10-CM

## 2016-01-24 LAB — CBC
HCT: 22.5 % — ABNORMAL LOW (ref 36.0–46.0)
HEMOGLOBIN: 7.4 g/dL — AB (ref 12.0–15.0)
MCH: 27.6 pg (ref 26.0–34.0)
MCHC: 32.9 g/dL (ref 30.0–36.0)
MCV: 84 fL (ref 78.0–100.0)
Platelets: 161 10*3/uL (ref 150–400)
RBC: 2.68 MIL/uL — ABNORMAL LOW (ref 3.87–5.11)
RDW: 17.1 % — AB (ref 11.5–15.5)
WBC: 9.2 10*3/uL (ref 4.0–10.5)

## 2016-01-24 MED ORDER — IBUPROFEN 600 MG PO TABS: 600.0000 mg | ORAL_TABLET | Freq: Four times a day (QID) | ORAL | Status: DC

## 2016-01-24 MED ORDER — FERROUS SULFATE 325 (65 FE) MG PO TABS: 325.0000 mg | ORAL_TABLET | Freq: Two times a day (BID) | ORAL | Status: DC

## 2016-01-24 NOTE — Progress Notes (Signed)
Sonya Padilla was referred for history of mental health. LCSW has reviewed chart and charts in care everywhere. Sonya Padilla is managed on medication: Lamictal (not during pregnancy), but will resume. Sonya Padilla has been involved in therapy with [facility] since 2015.  Documentation from [facility] in 2015: By social worker:  Social worker met with Ms. Sonya Padilla on this date due to teen pregnancy. She has delivered her first baby that she has named Sonya Padilla. The FOB is not involved and is currently incarcerated. Sonya Padilla lives at home with her mother and father. They are supportive of Sonya Padilla and will be helping her. Sonya Padilla is attending school and will go back to school in January. Her child will go to daycare and she is working with DSS on getting daycare vouchers at this time. She has already signed up for Medicaid and WIC. She plans on using [facility] and her mother will assist her with getting baby back and forth.  Sonya Padilla reports having everything she needs for the baby at home including a car seat and a crib. Sw talked with Sonya Padilla about her mental health history as she was going to counseling and on medication for depression. She continued to take Lamictal throughout her pregnancy for depression. Sw talked with her about post partum depression and she is aware of all th signs and symptoms. She will resume counseling when her daughter is 2 weeks of age.  Sonya Padilla is very mature for her age and has thought of almost of everything. She has a good support system at home. Sw will refer to [facility] due her age and history of depression. No further discharge needs at this time.  Electronically signed: Mary Freeman, BSW 04/18/2014 / 9:20 AM   She has had one admission to [facility] (2013) when she was 20 years old. There are no psychosocial needs or stressors in chart, prenatal record, or reporting.  Sonya Padilla was active in prenatal care per chart review with all appointments,  starting PNC at 8 weeks.  There is no lab result indicating THC use in pregnancy. It is reported she has a hx of THC use, but none reported during pregnancy/prenatal record.  10/24/13: all negative 06/12/15: all negative Cannabinoids UR QL: negative: 06/12/15  Baby UDS: on admission: negative Sonya Padilla was not tested.  Referral is screened out by Clinical Social Worker because none of the following criteria appear to apply: -History of anxiety/depression during this pregnancy, or of post-partum depression. - Diagnosis of anxiety and/or depression within last 3 years - History of depression due to pregnancy loss/loss of child or -Sonya Padilla's symptoms are currently being treated with medication and/or therapy.  Please contact the Clinical Social Worker if needs arise or upon Sonya Padilla request.   Sonya Bolda LCSW, MSW Clinical Social Work: System Wide Float Coverage for [place] [facility] Clinical social worker [phone] 

## 2016-01-24 NOTE — Progress Notes (Signed)
Post Partum Day 2  Subjective:  Sonya Padilla is a 20 y.o. [id number] [redacted]w[redacted]d s/p SVD.  No acute events overnight.  Pt denies problems with ambulating, voiding or po intake.  She denies nausea or vomiting.  Pain is well controlled.  She has had flatus. She has not had bowel movement.  Lochia Moderate.  Plan for birth control is Nexplanon, will get outpatient.  Method of Feeding: bottle  Objective: BP 97/51 mmHg  Pulse 77  Temp(Src) 98.3 F (36.8 C) (Oral)  Resp 19  Ht 5\' 5"  (1.651 m)  Wt 118.842 kg (262 lb)  BMI 43.60 kg/m2  SpO2 96%  LMP 04/09/2015 (Exact Date)  Breastfeeding? Unknown  Physical Exam:  General: alert, cooperative and no distress Lochia:normal flow  Chest: CTAB Heart: RRR no m/r/g Abdomen: +BS, soft, nontender, fundus firm at/below umbilicus Uterine Fundus: firm DVT Evaluation: No evidence of DVT seen on physical exam. Extremities: no edema   Recent Labs  01/22/16 2150 01/24/16 0548  HGB 10.6* 7.4*  HCT 32.8* 22.5*    Assessment/Plan:  ASSESSMENT: Sonya Padilla is a 20 y.o. G2P2002 [redacted]w[redacted]d ppd #2 s/p NSVD doing well. Anemic from [facility], but is asymptomatic. Send home with iron supplement. Ready for discharge today.   LOS: 2 days   Vella Kohler 01/24/2016, 7:37 AM   I have seen and examined this patient and agree the above assessment.  Respiratory effort normal, lochia appropriate, legs negative,  pain level normal. See separate dc summary  CRESENZO-DISHMAN,Aarnav Steagall 01/24/2016 9:04 AM

## 2016-01-24 NOTE — Discharge Summary (Signed)
OB Discharge Summary     Patient Name: Sonya Padilla DOB: April 04, 1996 MRN: [id number]  Date of admission: 01/22/2016 Delivering MD: Serita Grammes D   Date of discharge: 01/24/2016  Admitting diagnosis: 40WKS CTX 2MINS Intrauterine pregnancy: [redacted]w[redacted]d     Secondary diagnosis:  Active Problems:   Active labor  Additional problems: none     Discharge diagnosis: Term Pregnancy Delivered, Anemia and PPH                                                                                                Post partum procedures:none  Augmentation: AROM  Complications: None and [id number] (PPH of 750cc)  Hospital course:  Onset of Labor With Vaginal Delivery     20 y.o. yo G2P2002 at [redacted]w[redacted]d was admitted in Active Labor on 01/22/2016. Patient had an uncomplicated labor course as follows:  Membrane Rupture Time/Date: 1:23 AM ,01/23/2016   Intrapartum Procedures: Episiotomy: None [1]                                         Lacerations:  Periurethral [8]  Patient had a delivery of a Viable infant. 01/23/2016  Information for the patient's newborn:  Nitu, Valcin [id number]  Delivery Method: Vaginal, Spontaneous Delivery (Filed from Delivery Summary)     Pateint had an uncomplicated postpartum course.  She is ambulating, tolerating a regular diet, passing flatus, and urinating well. Patient is discharged home in stable condition on 01/24/2016.    Physical exam  Filed Vitals:   01/23/16 0845 01/23/16 1330 01/23/16 2110 01/24/16 0605  BP: 121/67 127/55 111/54 97/51  Pulse: 83 85 91 77  Temp: 98.3 F (36.8 C) 98.6 F (37 C) 98.9 F (37.2 C) 98.3 F (36.8 C)  TempSrc: Oral Axillary Oral Oral  Resp: 18 18 20 19   Height:      Weight:      SpO2:   96%    General: alert, cooperative and no distress No C/O dizziness, fatigue. Desires DC Lochia: appropriate Uterine Fundus: firm Incision: N/A DVT Evaluation: No evidence of DVT seen on physical exam. Labs: Lab Results   Component Value Date   WBC 9.2 01/24/2016   HGB 7.4* 01/24/2016   HCT 22.5* 01/24/2016   MCV 84.0 01/24/2016   PLT 161 01/24/2016   CMP Latest Ref Rng 01/13/2016  Glucose 65 - 99 mg/dL 88  BUN 6 - 20 mg/dL 8  Creatinine 0.57 - 1.00 mg/dL 0.61  Sodium 134 - 144 mmol/L 140  Potassium 3.5 - 5.2 mmol/L 4.3  Chloride 96 - 106 mmol/L 106  CO2 18 - 29 mmol/L 19  Calcium 8.7 - 10.2 mg/dL 8.9  Total Protein 6.0 - 8.5 g/dL 5.5(L)  Total Bilirubin 0.0 - 1.2 mg/dL 0.3  Alkaline Phos 39 - 117 IU/L 164(H)  AST 0 - 40 IU/L 15  ALT 0 - 32 IU/L 13    Discharge instruction: per After Visit Summary and "Baby and Me Booklet".  After visit meds:  Medication List    TAKE these medications        CITRANATAL 90 DHA 90-1 & 300 MG Misc  Take 1 bid     ferrous sulfate 325 (65 FE) MG tablet  Take 1 tablet (325 mg total) by mouth 2 (two) times daily with a meal.     ibuprofen 600 MG tablet  Commonly known as:  ADVIL,MOTRIN  Take 1 tablet (600 mg total) by mouth every 6 (six) hours.     PROAIR HFA 108 (90 Base) MCG/ACT inhaler  Generic drug:  albuterol  Inhale into the lungs every 6 (six) hours as needed for wheezing or shortness of breath. Reported on 01/13/2016        Diet: routine diet  Activity: Advance as tolerated. Pelvic rest for 6 weeks.   Outpatient follow up: Follow up Appt: Future Appointments Date Time Provider [place]  02/26/2016 3:30 PM Roma Schanz, CNM [facility]  05/06/2016 1:30 PM Carlis Stable, NP [facility]   Follow up Visit:No Follow-up on file.  Postpartum contraception: Nexplanon  Newborn Data: Live born female  Birth Weight: 9 lb 8 oz (4310 g) APGAR: 8, 9  Baby Feeding: Bottle Disposition:home with mother   01/24/2016 Wyvonnia Dusky, CNM

## 2016-01-24 NOTE — Discharge Instructions (Signed)

## 2016-01-26 LAB — TYPE AND SCREEN
ABO/RH(D): O NEG
Antibody Screen: POSITIVE
DAT, IGG: NEGATIVE
UNIT DIVISION: 0
Unit division: 0

## 2016-01-27 ENCOUNTER — Telehealth: Payer: Self-pay | Admitting: Obstetrics & Gynecology

## 2016-01-27 NOTE — Telephone Encounter (Signed)
Pt called stating that she would like a call back from a nurse, Pt did not state the reason for her call. Please contact pt

## 2016-01-27 NOTE — Telephone Encounter (Signed)
Pt c/o swelling in bilateral feet, SVD on 01/23/2016. Pt informed to elevated extremities, decrease salt intake, push water, if no improvement to call office back. Pt verbalized understanding.

## 2016-01-28 ENCOUNTER — Inpatient Hospital Stay (HOSPITAL_COMMUNITY): Payer: Medicaid Other

## 2016-01-31 ENCOUNTER — Telehealth: Payer: Self-pay | Admitting: *Deleted

## 2016-01-31 NOTE — Telephone Encounter (Signed)
Sonya Padilla nurse with [facility] called stating that the pt scored a 10 on the depression scale today, the pt has a history of depression . The pt is 7 days out from delivery and states that she is fine and feels well. I spoke with Dr. Glo Herring and he advised if the pt states that she feels fine and doesn't feel she is going to harm herself then it is fine.

## 2016-02-26 ENCOUNTER — Ambulatory Visit (INDEPENDENT_AMBULATORY_CARE_PROVIDER_SITE_OTHER): Payer: Medicaid Other | Admitting: Women's Health

## 2016-02-26 ENCOUNTER — Encounter: Payer: Self-pay | Admitting: Women's Health

## 2016-02-26 DIAGNOSIS — Z862 Personal history of diseases of the blood and blood-forming organs and certain disorders involving the immune mechanism: Secondary | ICD-10-CM

## 2016-02-26 DIAGNOSIS — Z8759 Personal history of other complications of pregnancy, childbirth and the puerperium: Secondary | ICD-10-CM | POA: Insufficient documentation

## 2016-02-26 NOTE — Progress Notes (Signed)
Subjective:    Sonya Padilla is a 20 y.o. G6P2002 Caucasian female who presents for a postpartum visit. She is 4 weeks postpartum following a spontaneous vaginal delivery at 40.2 gestational weeks. Anesthesia: epidural. PPH requiring hemabate, methergine, cytotec. I have fully reviewed the prenatal and intrapartum course. Postpartum course has been uncomplicated. Baby's course has been uncomplicated. Baby is feeding by bottle. Bleeding no bleeding. Bowel function is normal. Bladder function is feels like not emptying bladder completely- denies frequency/urgency/dysuria. Patient is not sexually active. Last sexual activity: prior to birth of baby. Contraception method is wants nexplanon. Postpartum depression screening: negative. Score 0.  Does have h/o depression/anxiety, not currently on anything and feels she is doing fine. Last pap <21yo.  The following portions of the patient's history were reviewed and updated as appropriate: allergies, current medications, past medical history, past surgical history and problem list.  Review of Systems Pertinent items are noted in HPI.   Vitals:   02/26/16 1545  BP: 118/64  Pulse: 80  Weight: 237 lb (107.5 kg)   No LMP recorded.  Objective:   General:  alert, cooperative and no distress   Breasts:  deferred, no complaints  Lungs: clear to auscultation bilaterally  Heart:  regular rate and rhythm  Abdomen: soft, nontender   Vulva: normal  Vagina: normal vagina  Cervix:  closed  Corpus: Well-involuted  Adnexa:  Non-palpable  Rectal Exam: No hemorrhoids        Assessment:   Postpartum exam 4 wks s/p SVB w/ PPH Bottlefeeding Depression screening Contraception counseling   Plan:   Contraception: abstinence until nexplanon insertion Follow up in: 1 week as scheduled for nexplanon insertion, or earlier if needed  [name] CNM, [facility] 02/26/2016 3:52 PM

## 2016-02-26 NOTE — Patient Instructions (Signed)
NO SEX UNTIL AFTER YOU GET YOUR BIRTH CONTROL   Etonogestrel implant What is this medicine? ETONOGESTREL (et oh noe JES trel) is a contraceptive (birth control) device. It is used to prevent pregnancy. It can be used for up to 3 years. This medicine may be used for other purposes; ask your health care provider or pharmacist if you have questions. What should I tell my health care provider before I take this medicine? They need to know if you have any of these conditions: -abnormal vaginal bleeding -blood vessel disease or blood clots -cancer of the breast, cervix, or liver -depression -diabetes -gallbladder disease -headaches -heart disease or recent heart attack -high blood pressure -high cholesterol -kidney disease -liver disease -renal disease -seizures -tobacco smoker -an unusual or allergic reaction to etonogestrel, other hormones, anesthetics or antiseptics, medicines, foods, dyes, or preservatives -pregnant or trying to get pregnant -breast-feeding How should I use this medicine? This device is inserted just under the skin on the inner side of your upper arm by a health care professional. Talk to your pediatrician regarding the use of this medicine in children. Special care may be needed. Overdosage: If you think you have taken too much of this medicine contact a poison control center or emergency room at once. NOTE: This medicine is only for you. Do not share this medicine with others. What if I miss a dose? This does not apply. What may interact with this medicine? Do not take this medicine with any of the following medications: -amprenavir -bosentan -fosamprenavir This medicine may also interact with the following medications: -barbiturate medicines for inducing sleep or treating seizures -certain medicines for fungal infections like ketoconazole and itraconazole -griseofulvin -medicines to treat seizures like carbamazepine, felbamate, oxcarbazepine, phenytoin,  topiramate -modafinil -phenylbutazone -rifampin -some medicines to treat HIV infection like atazanavir, indinavir, lopinavir, nelfinavir, tipranavir, ritonavir -St. John's wort This list may not describe all possible interactions. Give your health care provider a list of all the medicines, herbs, non-prescription drugs, or dietary supplements you use. Also tell them if you smoke, drink alcohol, or use illegal drugs. Some items may interact with your medicine. What should I watch for while using this medicine? This product does not protect you against HIV infection (AIDS) or other sexually transmitted diseases. You should be able to feel the implant by pressing your fingertips over the skin where it was inserted. Contact your doctor if you cannot feel the implant, and use a non-hormonal birth control method (such as condoms) until your doctor confirms that the implant is in place. If you feel that the implant may have broken or become bent while in your arm, contact your healthcare provider. What side effects may I notice from receiving this medicine? Side effects that you should report to your doctor or health care professional as soon as possible: -allergic reactions like skin rash, itching or hives, swelling of the face, lips, or tongue -breast lumps -changes in emotions or moods -depressed mood -heavy or prolonged menstrual bleeding -pain, irritation, swelling, or bruising at the insertion site -scar at site of insertion -signs of infection at the insertion site such as fever, and skin redness, pain or discharge -signs of pregnancy -signs and symptoms of a blood clot such as breathing problems; changes in vision; chest pain; severe, sudden headache; pain, swelling, warmth in the leg; trouble speaking; sudden numbness or weakness of the face, arm or leg -signs and symptoms of liver injury like dark yellow or brown urine; general ill feeling or flu-like  symptoms; light-colored stools; loss of  appetite; nausea; right upper belly pain; unusually weak or tired; yellowing of the eyes or skin -unusual vaginal bleeding, discharge -signs and symptoms of a stroke like changes in vision; confusion; trouble speaking or understanding; severe headaches; sudden numbness or weakness of the face, arm or leg; trouble walking; dizziness; loss of balance or coordination Side effects that usually do not require medical attention (Report these to your doctor or health care professional if they continue or are bothersome.): -acne -back pain -breast pain -changes in weight -dizziness -general ill feeling or flu-like symptoms -headache -irregular menstrual bleeding -nausea -sore throat -vaginal irritation or inflammation This list may not describe all possible side effects. Call your doctor for medical advice about side effects. You may report side effects to FDA at 1-800-FDA-1088. Where should I keep my medicine? This drug is given in a hospital or clinic and will not be stored at home. NOTE: This sheet is a summary. It may not cover all possible information. If you have questions about this medicine, talk to your doctor, pharmacist, or health care provider.    2016, Elsevier/Gold Standard. (2014-04-13 14:07:06)

## 2016-03-03 ENCOUNTER — Encounter: Payer: Medicaid Other | Admitting: Women's Health

## 2016-03-03 ENCOUNTER — Telehealth: Payer: Self-pay | Admitting: *Deleted

## 2016-03-03 NOTE — Telephone Encounter (Signed)
Pt states when can she expect her hormones to go back to normal from delivery, not on birth control, not breast feeding. Pt has an appt for tomorrow with Knute Neu, CNM can discuss concerns at that time. Pt verbalized understanding.

## 2016-03-04 ENCOUNTER — Ambulatory Visit (INDEPENDENT_AMBULATORY_CARE_PROVIDER_SITE_OTHER): Payer: Medicaid Other | Admitting: Women's Health

## 2016-03-04 ENCOUNTER — Encounter: Payer: Self-pay | Admitting: Women's Health

## 2016-03-04 VITALS — BP 104/62 | HR 84 | Wt 226.5 lb

## 2016-03-04 DIAGNOSIS — Z3049 Encounter for surveillance of other contraceptives: Secondary | ICD-10-CM

## 2016-03-04 DIAGNOSIS — Z3202 Encounter for pregnancy test, result negative: Secondary | ICD-10-CM | POA: Diagnosis not present

## 2016-03-04 DIAGNOSIS — Z30017 Encounter for initial prescription of implantable subdermal contraceptive: Secondary | ICD-10-CM

## 2016-03-04 DIAGNOSIS — Z302 Encounter for sterilization: Secondary | ICD-10-CM | POA: Insufficient documentation

## 2016-03-04 LAB — POCT URINE PREGNANCY: PREG TEST UR: NEGATIVE

## 2016-03-04 NOTE — Progress Notes (Signed)
Sonya Padilla is a 20 y.o. year old Caucasian female here for Nexplanon insertion.  No LMP recorded., last sexual intercourse was prior to birth of baby, and her pregnancy test today was negative.  Risks/benefits/side effects of Nexplanon have been discussed and her questions have been answered.  Specifically, a failure rate of 07/998 has been reported, with an increased failure rate if pt takes [place] and/or antiseizure medicaitons.  [name] is aware of the common side effect of irregular bleeding, which the incidence of decreases over time. She does have a h/o depression/anxiety- not currently on meds and doing well at this time. Does understand Nexplanon can potentially worsen depression/anxiety.   BP 104/62 (BP Location: Right Arm, Patient Position: Sitting, Cuff Size: Large)   Pulse 84   Wt 226 lb 8 oz (102.7 kg)   Breastfeeding? Yes   BMI 37.69 kg/m   Results for orders placed or performed in visit on 03/04/16 (from the past 24 hour(s))  POCT urine pregnancy   Collection Time: 03/04/16 12:01 PM  Result Value Ref Range   Preg Test, Ur Negative Negative     She is right-handed, so her left arm, approximately 4 inches proximal from the elbow, was cleansed with alcohol and anesthetized with 2cc of 2% Lidocaine.  The area was cleansed again with betadine and the Nexplanon was inserted per manufacturer's recommendations without difficulty.  A steri-strip and pressure bandage were applied.  Pt was instructed to keep the area clean and dry, remove pressure bandage in 24 hours, and keep insertion site covered with the steri-strip for 3-5 days.  Back up contraception was recommended for 2 weeks.  She was given a card indicating date Nexplanon was inserted and date it needs to be removed. Follow-up PRN problems, then @ 21yo for pap. If notices depression/anxiety worsening call and let us know.   [name] CNM, [facility] 03/04/2016 12:39 PM

## 2016-03-04 NOTE — Patient Instructions (Signed)

## 2016-03-10 ENCOUNTER — Telehealth: Payer: Self-pay | Admitting: Women's Health

## 2016-03-10 NOTE — Telephone Encounter (Signed)
Pt c/o sharp pain at incision site of nexplanon not consistent, no redness or swelling at site. Pt informed to continue to monitor if no improvement call our office back, nexplanon inserted on 03/04/2016.   Pt verbalized understanding.

## 2016-03-18 ENCOUNTER — Telehealth: Payer: Self-pay | Admitting: Women's Health

## 2016-03-18 NOTE — Telephone Encounter (Signed)
Pt c/o pain at incision site and feels like the device has turned positions. Pt given an appt for evaluation for tomorrow.

## 2016-03-19 ENCOUNTER — Ambulatory Visit (INDEPENDENT_AMBULATORY_CARE_PROVIDER_SITE_OTHER): Payer: Medicaid Other | Admitting: Adult Health

## 2016-03-19 ENCOUNTER — Encounter: Payer: Self-pay | Admitting: Adult Health

## 2016-03-19 VITALS — BP 120/62 | HR 88 | Ht 65.0 in | Wt 239.5 lb

## 2016-03-19 DIAGNOSIS — Z975 Presence of (intrauterine) contraceptive device: Secondary | ICD-10-CM | POA: Insufficient documentation

## 2016-03-19 DIAGNOSIS — F419 Anxiety disorder, unspecified: Secondary | ICD-10-CM | POA: Diagnosis not present

## 2016-03-19 DIAGNOSIS — M79602 Pain in left arm: Secondary | ICD-10-CM | POA: Insufficient documentation

## 2016-03-19 MED ORDER — BUSPIRONE HCL 5 MG PO TABS: 5.0000 mg | ORAL_TABLET | Freq: Two times a day (BID) | ORAL | 3 refills | Status: DC

## 2016-03-19 NOTE — Patient Instructions (Signed)
Generalized Anxiety Disorder Generalized anxiety disorder (GAD) is a mental disorder. It interferes with life functions, including relationships, work, and school. GAD is different from normal anxiety, which everyone experiences at some point in their lives in response to specific life events and activities. Normal anxiety actually helps [place] prepare for and get through these life events and activities. Normal anxiety goes away after the event or activity is over.  GAD causes anxiety that is not necessarily related to specific events or activities. It also causes excess anxiety in proportion to specific events or activities. The anxiety associated with GAD is also difficult to control. GAD can vary from mild to severe. People with severe GAD can have intense waves of anxiety with physical symptoms (panic attacks).  SYMPTOMS The anxiety and worry associated with GAD are difficult to control. This anxiety and worry are related to many life events and activities and also occur more days than not for 6 months or longer. People with GAD also have three or more of the following symptoms (one or more in children):  Restlessness.   Fatigue.  Difficulty concentrating.   Irritability.  Muscle tension.  Difficulty sleeping or unsatisfying sleep. DIAGNOSIS GAD is diagnosed through an assessment by your health care provider. Your health care provider will ask you questions aboutyour mood,physical symptoms, and events in your life. Your health care provider may ask you about your medical history and use of alcohol or drugs, including prescription medicines. Your health care provider may also do a physical exam and blood tests. Certain medical conditions and the use of certain substances can cause symptoms similar to those associated with GAD. Your health care provider may refer you to a mental health specialist for further evaluation. TREATMENT The following therapies are usually used to treat GAD:    Medication. Antidepressant medication usually is prescribed for long-term daily control. Antianxiety medicines may be added in severe cases, especially when panic attacks occur.   Talk therapy (psychotherapy). Certain types of talk therapy can be helpful in treating GAD by providing support, education, and guidance. A form of talk therapy called cognitive behavioral therapy can teach you healthy ways to think about and react to daily life events and activities.  Stress managementtechniques. These include yoga, meditation, and exercise and can be very helpful when they are practiced regularly. A mental health specialist can help determine which treatment is best for you. Some people see improvement with one therapy. However, other people require a combination of therapies.   This information is not intended to replace advice given to you by your health care provider. Make sure you discuss any questions you have with your health care provider.   Document Released: 10/24/2012 Document Revised: 07/20/2014 Document Reviewed: 10/24/2012 Elsevier Interactive Patient Education 2016 [place] nexplanon Start buspar  Recheck 1 week

## 2016-03-19 NOTE — Progress Notes (Signed)
Subjective:     Patient ID: Sonya Padilla, female   DOB: Aug 07, 1995, 20 y.o.   MRN: [id number]  HPI Richie is a 20 year old white female in complaining of pain in left arm where nexplanon is, it was inserted 8/23 and started hurting about a week later.She is saying she has anxiety but no depression.   Review of Systems +pain at nexplanon site +anxiety  Reviewed past medical,surgical, social and family history. Reviewed medications and allergies.     Objective:   Physical Exam BP 120/62 (BP Location: Left Arm, Patient Position: Sitting, Cuff Size: Large)   Pulse 88   Ht 5\' 5"  (1.651 m)   Wt 239 lb 8 oz (108.6 kg)   LMP 03/12/2016   Breastfeeding? No   BMI 39.85 kg/m Nexplanon in place and easily palpated, but she says it is painful over rod, discussed trying buspar for anxiety and she agrees    Assessment:     1. Pain In Left Arm   2. Nexplanon in place   3. Anxiety       Plan:     Rx buspar 5 mg #60 take 1 bid with 3 refills Leave nexplanon alone Return in 1 week if still hurts will remove nexplanon Review handout on anxiety

## 2016-04-01 ENCOUNTER — Ambulatory Visit (INDEPENDENT_AMBULATORY_CARE_PROVIDER_SITE_OTHER): Payer: Medicaid Other | Admitting: Adult Health

## 2016-04-01 ENCOUNTER — Encounter: Payer: Self-pay | Admitting: Adult Health

## 2016-04-01 VITALS — BP 130/62 | HR 92 | Ht 65.0 in | Wt 236.5 lb

## 2016-04-01 DIAGNOSIS — Z3049 Encounter for surveillance of other contraceptives: Secondary | ICD-10-CM

## 2016-04-01 DIAGNOSIS — Z3202 Encounter for pregnancy test, result negative: Secondary | ICD-10-CM

## 2016-04-01 DIAGNOSIS — Z3046 Encounter for surveillance of implantable subdermal contraceptive: Secondary | ICD-10-CM

## 2016-04-01 DIAGNOSIS — Z30011 Encounter for initial prescription of contraceptive pills: Secondary | ICD-10-CM

## 2016-04-01 LAB — POCT URINE PREGNANCY: PREG TEST UR: NEGATIVE

## 2016-04-01 MED ORDER — NORETHIN-ETH ESTRAD-FE BIPHAS 1 MG-10 MCG / 10 MCG PO TABS: 1.0000 | ORAL_TABLET | Freq: Every day | ORAL | 11 refills | Status: DC

## 2016-04-01 NOTE — Patient Instructions (Signed)
Use condoms x 4 weeks, keep clean and dry x 24 hours, no heavy lifting, keep steri strips on x 72 hours, Keep pressure dressing on x 24 hours. Follow up prn problems. Start lo loestrin today 

## 2016-04-01 NOTE — Progress Notes (Signed)
Subjective:     Patient ID: Sonya Padilla, female   DOB: 30-Oct-1995, 20 y.o.   MRN: [id number]  HPI [name] is a 20 year old white female in for nexplanon removal and get on OCs.  Review of Systems For nexplanon removal Reviewed past medical,surgical, social and family history. Reviewed medications and allergies.     Objective:   Physical Exam BP 130/62 (BP Location: Left Arm, Patient Position: Sitting, Cuff Size: Large)   Pulse 92   Ht 5\' 5"  (1.651 m)   Wt 236 lb 8 oz (107.3 kg)   LMP 03/12/2016 (Exact Date)   Breastfeeding? No   BMI 39.36 kg/m UPT negative,consent signed, time out called, left arm cleansed with betadine, and injected with 2.5 cc 2% lidocaine and waited til numb.Under sterile technique a #11 blade was used to make small vertical incision, and a curved forceps was used to easily remove rod. Steri strips applied. Pressure dressing applied.PHQ 2 score 0   Discussed OCs , will try lo loestrin. Assessment:     1. Encounter for Nexplanon removal   2. Pregnancy examination or test, negative result   3. Encounter for initial prescription of contraceptive pills       Plan:     Use condoms x 4 weeks, keep clean and dry x 24 hours, no heavy lifting, keep steri strips on x 72 hours, Keep pressure dressing on x 24 hours. Follow up prn problems.   Rx lo loestrin take 1 daily disp 1 pack with 11 refills, given 1 pack to start today

## 2016-04-02 ENCOUNTER — Telehealth: Payer: Self-pay | Admitting: *Deleted

## 2016-04-02 MED ORDER — NORETHINDRONE 0.35 MG PO TABS: 1.0000 | ORAL_TABLET | Freq: Every day | ORAL | 11 refills | Status: DC

## 2016-04-02 NOTE — Telephone Encounter (Signed)
Sonya Padilla called and wants POP, will rx micronor

## 2016-04-02 NOTE — Telephone Encounter (Signed)
Pt states she saw Derrek Monaco, NP yesterday and was prescribed Lo loestrin, pt states she would like to be prescribe Micronor due to being Progesterone only birth control. Please advise.

## 2016-04-21 ENCOUNTER — Telehealth: Payer: Self-pay | Admitting: Adult Health

## 2016-04-21 DIAGNOSIS — Z3200 Encounter for pregnancy test, result unknown: Secondary | ICD-10-CM

## 2016-04-21 NOTE — Telephone Encounter (Signed)
She feels like she is pregnant and wants [facility], she is taking micronor and has  Had negative HPT but when pregnant in past had to get blood test.will order [facility]

## 2016-04-21 NOTE — Telephone Encounter (Signed)
Pt states she would like to discuss concerns with Derrek Monaco, NP

## 2016-04-30 LAB — BETA HCG QUANT (REF LAB)

## 2016-05-01 ENCOUNTER — Telehealth: Payer: Self-pay | Admitting: Adult Health

## 2016-05-01 NOTE — Telephone Encounter (Signed)
Spoke with pt letting her know her quant was negative. Pt voiced understanding. [place]

## 2016-05-05 ENCOUNTER — Telehealth: Payer: Self-pay | Admitting: Adult Health

## 2016-05-05 NOTE — Telephone Encounter (Signed)
Pt called stating that she would like to speak with Anderson Malta. Pt did not state the reason why. Please contact pt

## 2016-05-05 NOTE — Telephone Encounter (Signed)
Has pain in side and feels tired, to make appt for tomorrow

## 2016-05-06 ENCOUNTER — Ambulatory Visit (INDEPENDENT_AMBULATORY_CARE_PROVIDER_SITE_OTHER): Payer: Medicaid Other

## 2016-05-06 ENCOUNTER — Other Ambulatory Visit: Payer: Medicaid Other

## 2016-05-06 ENCOUNTER — Ambulatory Visit (INDEPENDENT_AMBULATORY_CARE_PROVIDER_SITE_OTHER): Payer: Medicaid Other | Admitting: Nurse Practitioner

## 2016-05-06 ENCOUNTER — Encounter: Payer: Self-pay | Admitting: Adult Health

## 2016-05-06 ENCOUNTER — Encounter: Payer: Self-pay | Admitting: Nurse Practitioner

## 2016-05-06 ENCOUNTER — Ambulatory Visit (INDEPENDENT_AMBULATORY_CARE_PROVIDER_SITE_OTHER): Payer: Medicaid Other | Admitting: Adult Health

## 2016-05-06 VITALS — BP 134/79 | HR 89 | Temp 98.0°F | Ht 65.0 in | Wt 240.4 lb

## 2016-05-06 VITALS — BP 118/60 | HR 92 | Ht 65.0 in | Wt 244.0 lb

## 2016-05-06 DIAGNOSIS — N854 Malposition of uterus: Secondary | ICD-10-CM | POA: Diagnosis not present

## 2016-05-06 DIAGNOSIS — R1084 Generalized abdominal pain: Secondary | ICD-10-CM | POA: Diagnosis not present

## 2016-05-06 DIAGNOSIS — R102 Pelvic and perineal pain: Secondary | ICD-10-CM

## 2016-05-06 DIAGNOSIS — R197 Diarrhea, unspecified: Secondary | ICD-10-CM | POA: Diagnosis not present

## 2016-05-06 NOTE — Assessment & Plan Note (Signed)
The patient is having loose stools, up to 8 a day described as watery the past 3-4 weeks. Abdominal cramping is associated with this, as noted above. This point I will check for infection including CBC, C. difficile, GI pathogen panel. I will also check a BMP for electrolyte. We will call her with the results. There is no infection we can consider Bentyl 10 mg 3 times a day and at bedtime as needed for diarrhea and or abdominal cramping. Discussed need to push fluids to prevent dehydration. Return for follow-up in 4-6 weeks or call us if any worsening source of your symptoms.

## 2016-05-06 NOTE — Progress Notes (Signed)
Subjective:     Patient ID: Sonya Padilla, female   DOB: [birth year], 20 y.o.   MRN: [id number]  HPI Km is a 20 year old white female in complaining of pain in pelvic area,esp right side, has had cyst in past.She is taking micronor.   Review of Systems +pelvic pain R>L?cyst Reviewed past medical,surgical, social and family history. Reviewed medications and allergies.     Objective:   Physical Exam BP 118/60 (BP Location: Left Arm, Patient Position: Sitting, Cuff Size: Large)   Pulse 92   Ht 5\' 5"  (1.651 m)   Wt 244 lb (110.7 kg)   LMP 04/11/2016 (Approximate)   Breastfeeding? No   BMI 40.60 kg/m  Skin warm and dry.Pelvic: external genitalia is normal in appearance no lesions, vagina: white discharge without odor,urethra has no lesions or masses noted, cervix:smooth and bulbous,negative CMT uterus: normal size, shape and contour, mildly tender, no masses felt, adnexa: no masses + tenderness noted,R>L. Bladder is non tender and no masses felt   PHQ 2 score 0.  Assessment:     Pelvic pain    Plan:     Return this afternoon for GYN [place]  Review handout on pelvic pain

## 2016-05-06 NOTE — Assessment & Plan Note (Signed)
The patient now notes lower, crampy abdominal pain associated with diarrhea. This is new onset for the past 3-4 weeks. Differentials include limited gastroenteritis, irritable bowel syndrome, or more occult GI infection. Workup as stated above, we'll call with results. If no infection she can try Bentyl 10 mg 3 times a day and at bedtime as needed for diarrhea and abdominal cramping. The cramping does subside after a bowel movement. Return for follow-up in 4-6 weeks.

## 2016-05-06 NOTE — Patient Instructions (Signed)
1. When you collect your stool sample, bring it to the lab. 2. You can have your blood test done at that time. 3. We will call you to results. Exline if you do not have an acute infection we can trial of medication to help with diarrhea. 4. Return for follow-up in 4-6 weeks.  5. Call us if you have any severe or worsening symptoms.  6. As we discussed, try to drink plenty of fluids to prevent dehydration.  Congrats on having your baby, he is beautiful!  : )

## 2016-05-06 NOTE — Progress Notes (Signed)
cc'ed to pcp °

## 2016-05-06 NOTE — Patient Instructions (Signed)
Return this pm for US Pelvic Pain, Female Female pelvic pain can be caused by many different things and start from a variety of places. Pelvic pain refers to pain that is located in the lower half of the abdomen and between your hips. The pain may occur over a short period of time (acute) or may be reoccurring (chronic). The cause of pelvic pain may be related to disorders affecting the female reproductive organs (gynecologic), but it may also be related to the bladder, kidney stones, an intestinal complication, or muscle or skeletal problems. Getting help right away for pelvic pain is important, especially if there has been severe, sharp, or a sudden onset of unusual pain. It is also important to get help right away because some types of pelvic pain can be life threatening.  CAUSES  Below are only some of the causes of pelvic pain. The causes of pelvic pain can be in one of several categories.   Gynecologic.  Pelvic inflammatory disease.  Sexually transmitted infection.  Ovarian cyst or a twisted ovarian ligament (ovarian torsion).  Uterine lining that grows outside the uterus (endometriosis).  Fibroids, cysts, or tumors.  Ovulation.  Pregnancy.  Pregnancy that occurs outside the uterus (ectopic pregnancy).  Miscarriage.  Labor.  Abruption of the placenta or ruptured uterus.  Infection.  Uterine infection (endometritis).  Bladder infection.  Diverticulitis.  Miscarriage related to a uterine infection (septic abortion).  Bladder.  Inflammation of the bladder (cystitis).  Kidney stone(s).  Gastrointestinal.  Constipation.  Diverticulitis.  Neurologic.  Trauma.  Feeling pelvic pain because of mental or emotional causes (psychosomatic).  Cancers of the bowel or pelvis. EVALUATION  Your caregiver will want to take a careful history of your concerns. This includes recent changes in your health, a careful gynecologic history of your periods (menses), and a sexual  history. Obtaining your family history and medical history is also important. Your caregiver may suggest a pelvic exam. A pelvic exam will help identify the location and severity of the pain. It also helps in the evaluation of which organ system may be involved. In order to identify the cause of the pelvic pain and be properly treated, your caregiver may order tests. These tests may include:   A pregnancy test.  Pelvic ultrasonography.  An X-ray exam of the abdomen.  A urinalysis or evaluation of vaginal discharge.  Blood tests. HOME CARE INSTRUCTIONS   Only take over-the-counter or prescription medicines for pain, discomfort, or fever as directed by your caregiver.   Rest as directed by your caregiver.   Eat a balanced diet.   Drink enough fluids to make your urine clear or pale yellow, or as directed.   Avoid sexual intercourse if it causes pain.   Apply warm or cold compresses to the lower abdomen depending on which one helps the pain.   Avoid stressful situations.   Keep a journal of your pelvic pain. Write down when it started, where the pain is located, and if there are things that seem to be associated with the pain, such as food or your menstrual cycle.  Follow up with your caregiver as directed.  SEEK MEDICAL CARE IF:  Your medicine does not help your pain.  You have abnormal vaginal discharge. SEEK IMMEDIATE MEDICAL CARE IF:   You have heavy bleeding from the vagina.   Your pelvic pain increases.   You feel light-headed or faint.   You have chills.   You have pain with urination or blood in your urine.  You have uncontrolled diarrhea or vomiting.   You have a fever or persistent symptoms for more than 3 days.  You have a fever and your symptoms suddenly get worse.   You are being physically or sexually abused.   This information is not intended to replace advice given to you by your health care provider. Make sure you discuss any  questions you have with your health care provider.   Document Released: 05/26/2004 Document Revised: 03/20/2015 Document Reviewed: 10/19/2011 Elsevier Interactive Patient Education [employer].

## 2016-05-06 NOTE — Progress Notes (Signed)
Referring Provider: Timmothy Euler, MD Primary Care Physician:  Kenn File, MD Primary GI:  Dr. Gala Romney  Chief Complaint  Patient presents with  . Abdominal Cramping    before having bm (diarrhea), x3-4 weeks  . Diarrhea    HPI:   Sonya Padilla is a 20 y.o. female who presents For evaluation of abdominal cramping and diarrhea. She was previously seen by our office on 10/30/2015 for follow-up on GERD and constipation at which point she was doing well. At that time she also notified us it is confirmed she is pregnant and is following 0B for pregnancy monitoring. Most medications had been discontinued. Her GERD symptoms were not as well controlled because her Dexon had to be stopped due to pregnancy. At that time she noted constipation with a bowel movement about every 5 days, Benefiber twice a day has not helped. Drinks about 6 bottles of water a day but minimal fiber intake. It was recommended she increase her fiber intake. A message sent to her OB to ask about use of MiraLAX and pregnancy. Recommended trigger avoidance. Follow-up in 6 months.  He gave birth on 01/23/2016 to a healthy, viable infant after 40 weeks and 2 days pregnancy.  Today she states she's doing well voerall. Pregnancy and delivery went well. Statrted having diarrhea about 3-4 weeks ago, having up to 8 loose/watery stools a day. Denies hematochezia. Having lower abdominal cramping which improves with a bowel movement. Has N/V as well, last episode of emesis yesterday. Denies hematemesis. GERD is essentially resolved at this point, no currently on PPI or other antacids. Denies dyspnea, syncope, near syncope. Denies any other upper or lower GI symptoms.  Past Medical History:  Diagnosis Date  . Amenorrhea 10/02/2014  . Broken leg    left  . Depression   . Irregular periods 05/13/2015  . Nausea 03/13/2015  . Obesity   . Pelvic pain in female 05/13/2015  . Pregnant 05/13/2015  . RLQ abdominal pain 10/02/2014   . Suicide ([place])   . Urinary frequency 03/13/2015    Past Surgical History:  Procedure Laterality Date  . ESOPHAGOGASTRODUODENOSCOPY N/A 12/12/2014   [id number] reflux     Current Outpatient Prescriptions  Medication Sig Dispense Refill  . albuterol (PROAIR HFA) 108 (90 BASE) MCG/ACT inhaler Inhale into the lungs every 6 (six) hours as needed for wheezing or shortness of breath. Reported on 01/13/2016    . busPIRone (BUSPAR) 5 MG tablet Take 1 tablet (5 mg total) by mouth 2 (two) times daily. 60 tablet 3  . norethindrone (MICRONOR,CAMILA,ERRIN) 0.35 MG tablet Take 1 tablet (0.35 mg total) by mouth daily. 1 Package 11   No current facility-administered medications for this visit.     Allergies as of 05/06/2016 - Review Complete 05/06/2016  Allergen Reaction Noted  . Tylenol [acetaminophen] Other (See Comments) 09/14/2013    Family History  Problem Relation Age of Onset  . Hypertension Mother   . Stroke Paternal Aunt   . Diabetes Maternal Grandmother   . Heart disease Maternal Grandmother   . Hyperlipidemia Maternal Grandmother   . Drug abuse Maternal Grandmother   . Glaucoma Maternal Grandmother   . Cataracts Maternal Grandmother   . Cancer Paternal Grandfather   . Colon cancer Paternal Grandfather   . Psoriasis Sister   . Prostate cancer Maternal Grandfather   . Alcohol abuse Maternal Grandfather   . Other Father     heart issues  . Heart attack Father   . Cervical  cancer Maternal Aunt   . Physical abuse Maternal Uncle   . Obesity Other   . Bipolar disorder Other     paternal aunt and cousin  . Drug abuse Other     Social History   Social History  . Marital status: Single    Spouse name: N/A  . Number of children: N/A  . Years of education: N/A   Occupational History  . student     9th grade at [employer]   Social History Main Topics  . Smoking status: Former Smoker    Packs/day: 1.00    Years: 2.00    Types: Cigarettes    Quit date: 11/27/2012  .  Smokeless tobacco: Former Systems developer    Types: Chew     Comment: quit in 04/2015  . Alcohol use No  . Drug use: No  . Sexual activity: Yes    Partners: Male    Birth control/ protection: Pill   Other Topics Concern  . None   Social History Narrative  . None    Review of Systems: General: Negative for anorexia, weight loss, fever, chills, fatigue, weakness. ENT: Negative for hoarseness, difficulty swallowing. CV: Negative for chest pain, angina, palpitations, peripheral edema.  Respiratory: Negative for dyspnea at rest, cough, sputum, wheezing.  GI: See history of present illness. Endo: Negative for unusual weight change.  Heme: Negative for bruising or bleeding.   Physical Exam: BP 134/79   Pulse 89   Temp 98 F (36.7 C) (Oral)   Ht 5\' 5"  (1.651 m)   Wt 240 lb 6.4 oz (109 kg)   LMP 04/11/2016 (Approximate)   BMI 40.00 kg/m  General:   Morbidly obese female. Alert and oriented. Pleasant and cooperative. Well-nourished and well-developed.  Eyes:  Without icterus, sclera clear and conjunctiva pink.  Ears:  Normal auditory acuity. Cardiovascular:  S1, S2 present without murmurs appreciated. Extremities without clubbing or edema. Respiratory:  Clear to auscultation bilaterally. No wheezes, rales, or rhonchi. No distress.  Gastrointestinal:  +BS, soft, and non-distended. Mild lower abdominal TTP. No HSM noted. No guarding or rebound. No masses appreciated.  Rectal:  Deferred  Musculoskalatal:  Symmetrical without gross deformities. Neurologic:  Alert and oriented x4;  grossly normal neurologically. Psych:  Alert and cooperative. Normal mood and affect. Heme/Lymph/Immune: No excessive bruising noted.    05/06/2016 1:55 PM   Disclaimer: This note was dictated with voice recognition software. Similar sounding words can inadvertently be transcribed and may not be corrected upon review.

## 2016-05-06 NOTE — Progress Notes (Signed)
PELVIC US TA/TV: Homogeneous anteverted uterus,wnl,normal ov's bilat (mobile),EEC 17 mm,no free fluid,right adnexal discomfort during ultrasound

## 2016-05-07 ENCOUNTER — Telehealth: Payer: Self-pay | Admitting: *Deleted

## 2016-05-07 NOTE — Telephone Encounter (Signed)
Left message I called 

## 2016-05-08 ENCOUNTER — Telehealth: Payer: Self-pay | Admitting: Adult Health

## 2016-05-08 NOTE — Telephone Encounter (Signed)
Pt aware of [place], has had diarrhea, F/U with GI

## 2016-05-08 NOTE — Telephone Encounter (Signed)
Spoke with pt. Pt wonders if anything can be done about her anteverted uterus. Also, her [place] mentioned normal endometrium of a menstruating woman and pt states she's not having periods. Please advise. Thanks!! [place]

## 2016-05-13 ENCOUNTER — Telehealth: Payer: Self-pay | Admitting: Family Medicine

## 2016-05-13 NOTE — Telephone Encounter (Signed)
FYI Per pt she feels like something is crawling in her ear and in her head Offered appt  Pt states she can not come in until 11/6 She has appt for that day Instructed pt to come in sooner if sxs worsen or persist

## 2016-05-14 NOTE — Telephone Encounter (Signed)
Agree, needs appt sooner.   Laroy Apple, MD [facility] 05/14/2016, 7:44 AM

## 2016-05-14 NOTE — Telephone Encounter (Signed)
Spoke with patient, offered appointment today 05/14/16 but patient refused, does not want to be seen until 05/20/16.

## 2016-05-20 ENCOUNTER — Ambulatory Visit (INDEPENDENT_AMBULATORY_CARE_PROVIDER_SITE_OTHER): Payer: Medicaid Other | Admitting: Family Medicine

## 2016-05-20 ENCOUNTER — Encounter: Payer: Self-pay | Admitting: Family Medicine

## 2016-05-20 VITALS — BP 129/58 | HR 100 | Temp 97.0°F | Ht 65.0 in | Wt 240.4 lb

## 2016-05-20 DIAGNOSIS — H6981 Other specified disorders of Eustachian tube, right ear: Secondary | ICD-10-CM

## 2016-05-20 MED ORDER — CARBAMIDE PEROXIDE 6.5 % OT SOLN: 5.0000 [drp] | Freq: Two times a day (BID) | OTIC | 0 refills | Status: DC

## 2016-05-20 MED ORDER — CETIRIZINE HCL 10 MG PO TABS
10.0000 mg | ORAL_TABLET | Freq: Every day | ORAL | 2 refills | Status: DC
Start: 2016-05-20 — End: 2016-09-30

## 2016-05-20 NOTE — Patient Instructions (Signed)
Great to see you!  Try daily Zyrtec to help with the crunching sound in your ears. Also tried Debrox to see if you get improvement in the feeling of foreign body being in your ear.

## 2016-05-20 NOTE — Progress Notes (Signed)
   HPI  Patient presents today ear with ear discomfort.  Patient states that over the last 2-3 weeks she feels like something is in her right ear she also has crunching sounds when she swallows and frequent ear popping.  She reports normal hearing. She does not know what could be in her ear. She denies any drainage or pain.  PMH: Smoking status noted ROS: Per HPI  Objective: BP (!) 129/58   Pulse 100   Temp 97 F (36.1 C) (Oral)   Ht 5\' 5"  (1.651 m)   Wt 240 lb 6.4 oz (109 kg)   LMP 04/11/2016 (Approximate)   BMI 40.00 kg/m  Gen: NAD, alert, cooperative with exam HEENT: NCAT, right TM within normal limits, small hair located touching the TM around 2:00 position, 8 erythema and swelling of the mucosa of the nares bilaterally, left TM within normal limits CV: RRR, good S1/S2, no murmur Resp: CTABL, no wheezes, non-labored Ext: No edema, warm Neuro: Alert and oriented, No gross deficits  Assessment and plan:  # Eustachian tube dysfunction Ear popping and crunching sounds likely due to eustachian tube dysfunction, start Zyrtec I believe the hair touching her eardrum may be causing the foreign body sensation, trial of debrox see if it can dislodge the hair and provide relief. Return to clinic as needed      Meds ordered this encounter  Medications  . cetirizine (ZYRTEC) 10 MG tablet    Sig: Take 1 tablet (10 mg total) by mouth daily.    Dispense:  30 tablet    Refill:  2  . carbamide peroxide (DEBROX) 6.5 % otic solution    Sig: Place 5 drops into the right ear 2 (two) times daily.    Dispense:  15 mL    Refill:  0    Laroy Apple, MD [facility] 05/20/2016, 2:24 PM

## 2016-06-02 IMAGING — US US ABDOMEN LIMITED
1 series · 14 of 25 positions shown · non-contrast
Comparison: None.

CLINICAL DATA: Right upper quadrant pain, nausea

EXAM:
US ABDOMEN LIMITED - RIGHT UPPER QUADRANT

[Series 1: us abdomen limited · 0.20mm/px · 14 of 67 slices shown]
[im 1/67]
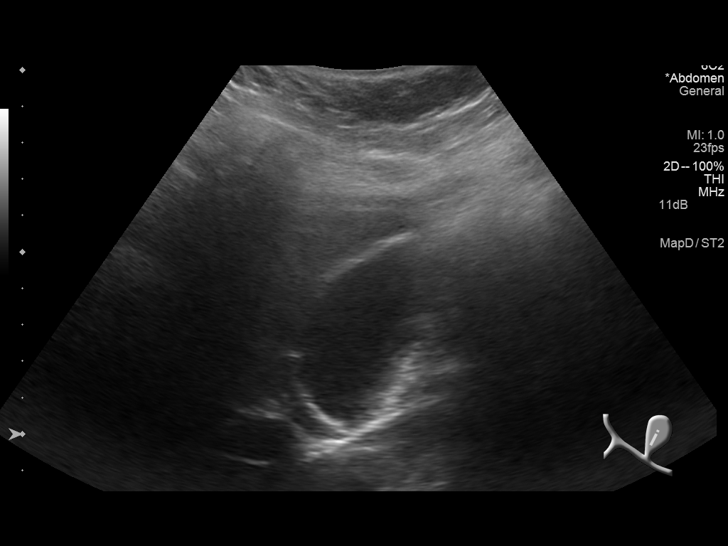
[im 6/67]
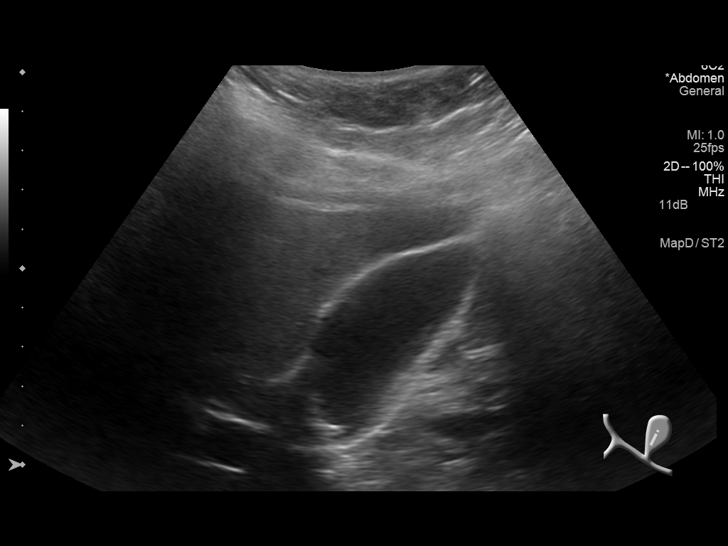
[im 12/67]
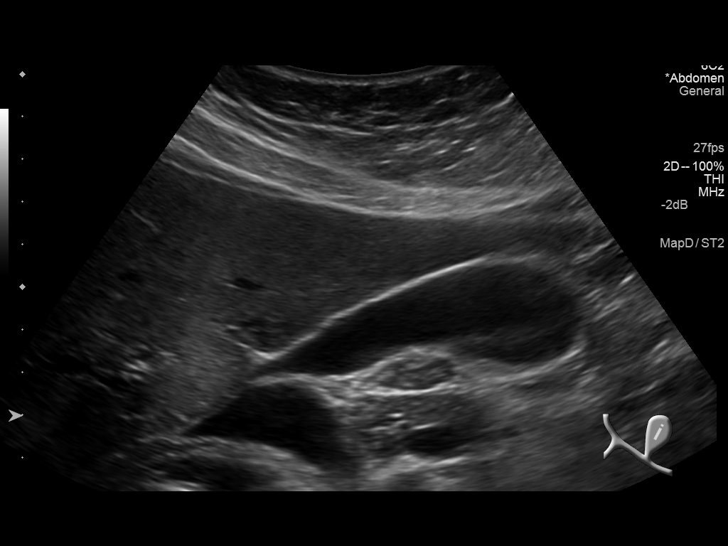
[im 17/67]
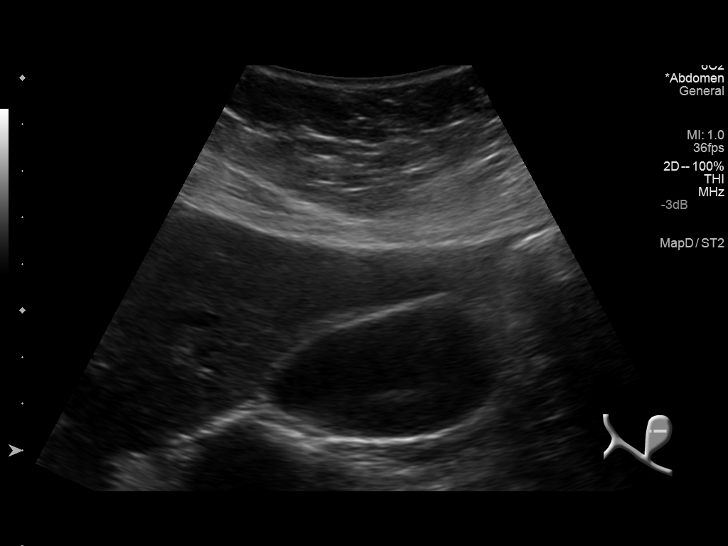
[im 23/67]
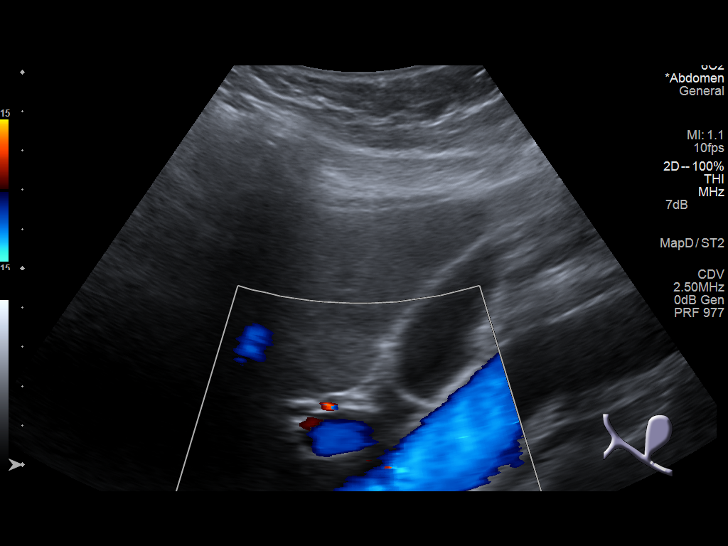
[im 25/67]
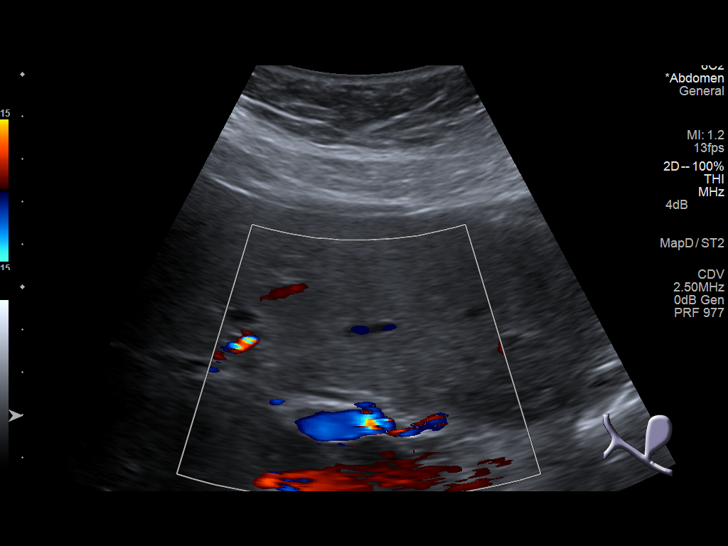
[im 31/67]
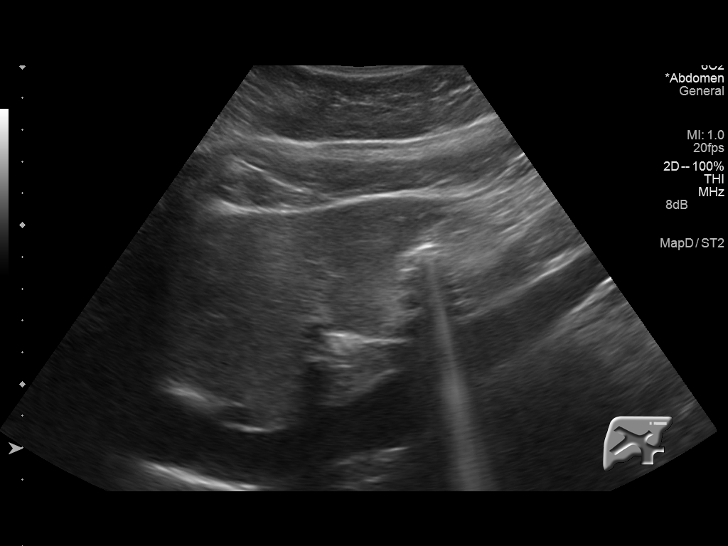
[im 36/67]
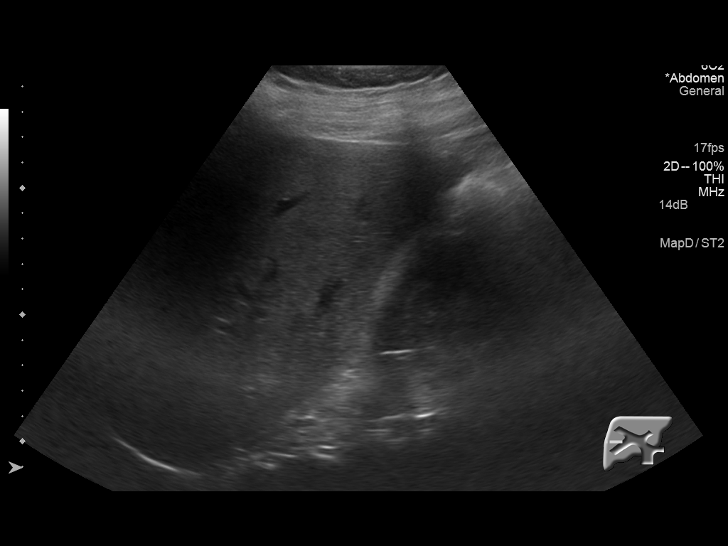
[im 42/67]
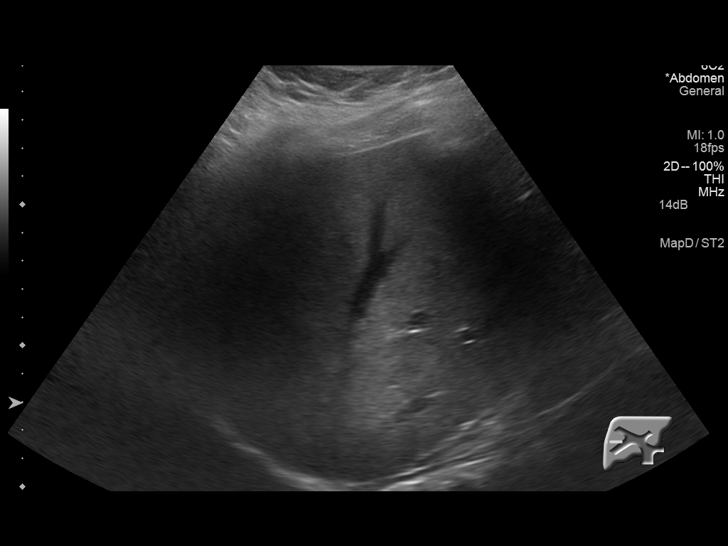
[im 45/67]
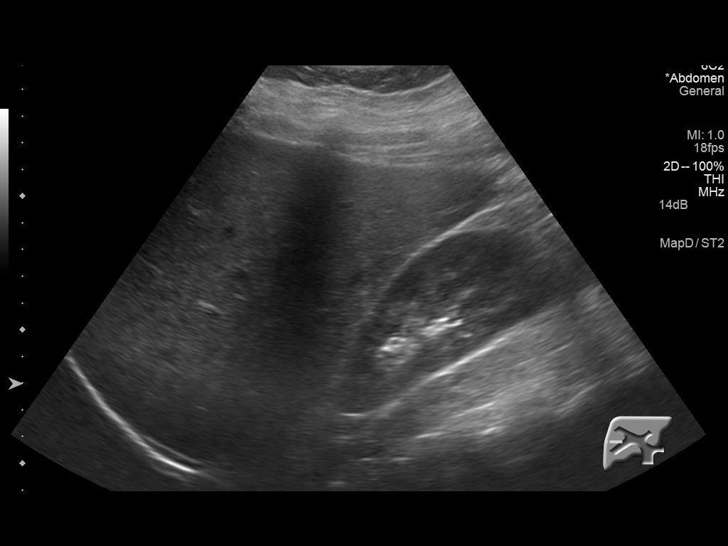
[im 50/67]
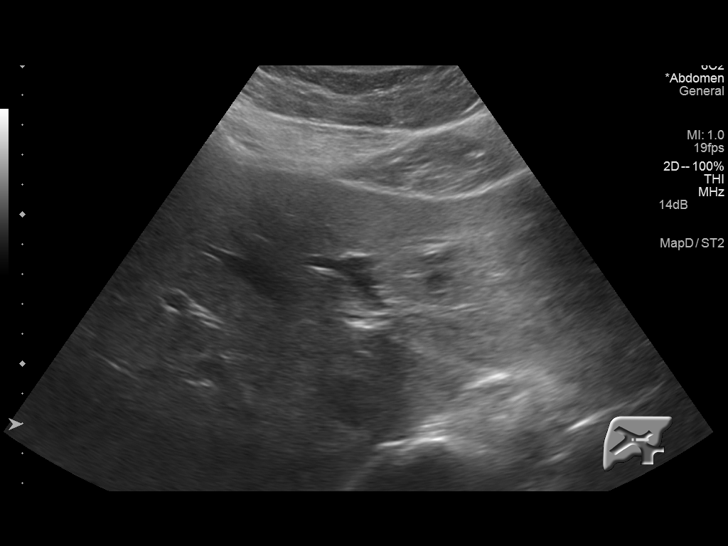
[im 56/67]
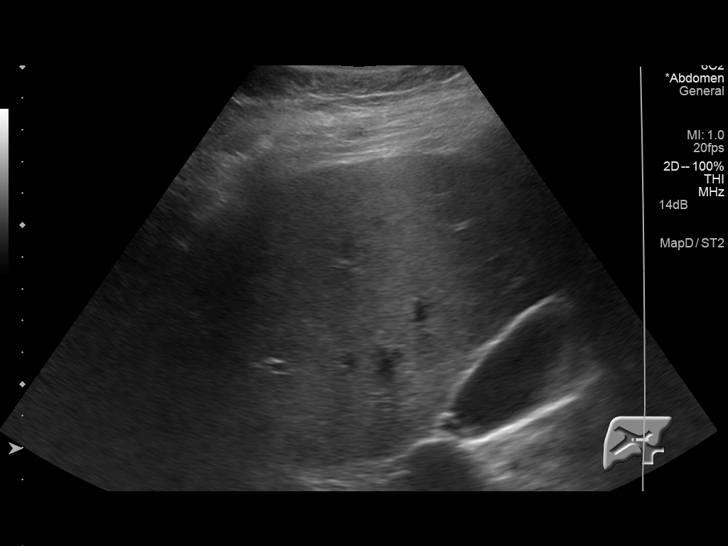
[im 61/67]
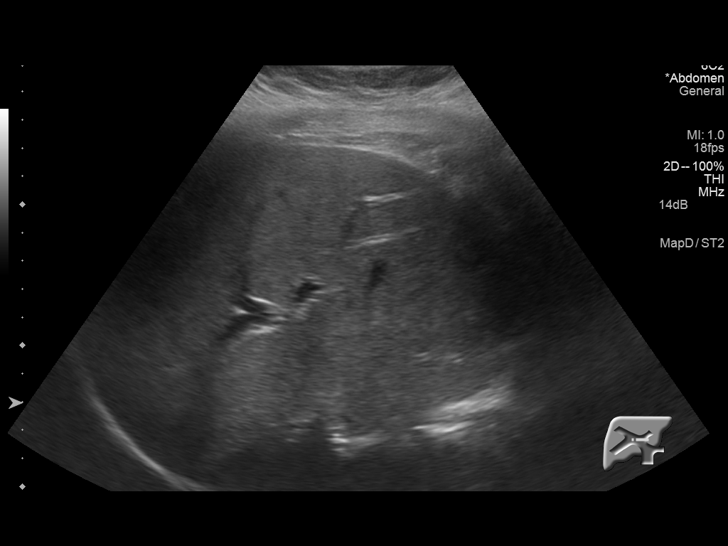
[im 67/67]
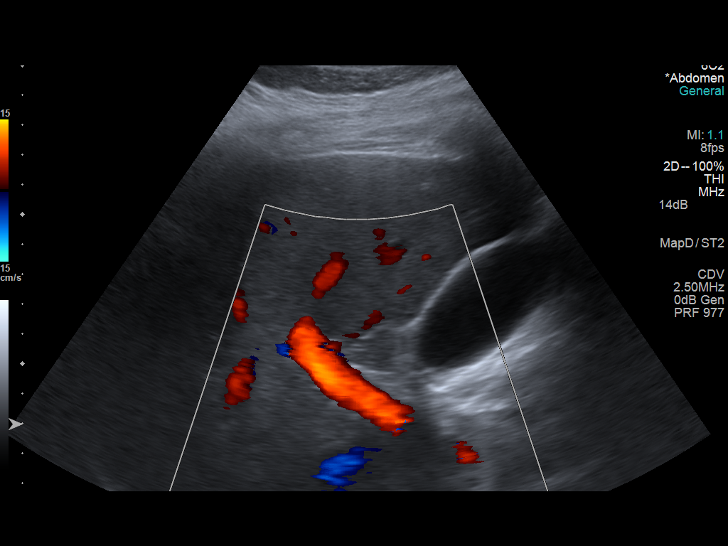

[14 of 25 positions shown; findings below may reference images not displayed]

FINDINGS: Gallbladder:

No gallstones, gallbladder wall thickening, or pericholecystic
fluid. Negative sonographic Murphy's sign.

Common bile duct:

Diameter: 3 mm

Liver:

No focal lesion identified. At the upper limits of normal for
parenchymal echogenicity.
IMPRESSION: Negative right upper quadrant ultrasound.

## 2016-06-10 ENCOUNTER — Ambulatory Visit: Payer: Medicaid Other | Admitting: Nurse Practitioner

## 2016-06-17 ENCOUNTER — Ambulatory Visit: Payer: Self-pay | Admitting: Family Medicine

## 2016-06-24 ENCOUNTER — Encounter: Payer: Self-pay | Admitting: Nurse Practitioner

## 2016-06-24 ENCOUNTER — Ambulatory Visit (INDEPENDENT_AMBULATORY_CARE_PROVIDER_SITE_OTHER): Payer: Medicaid Other | Admitting: Nurse Practitioner

## 2016-06-24 VITALS — BP 119/66 | HR 85 | Temp 97.8°F | Ht 65.0 in | Wt 244.0 lb

## 2016-06-24 DIAGNOSIS — R197 Diarrhea, unspecified: Secondary | ICD-10-CM | POA: Diagnosis not present

## 2016-06-24 DIAGNOSIS — R1084 Generalized abdominal pain: Secondary | ICD-10-CM

## 2016-06-24 MED ORDER — DICYCLOMINE HCL 10 MG PO CAPS: 10.0000 mg | ORAL_CAPSULE | Freq: Three times a day (TID) | ORAL | 0 refills | Status: DC

## 2016-06-24 NOTE — Progress Notes (Signed)
Referring Provider: Timmothy Euler, MD Primary Care Physician:  Kenn File, MD Primary GI:  Dr. Gala Romney  Chief Complaint  Patient presents with  . Abdominal Pain    HPI:   Sonya Padilla is a 20 y.o. female who presents for follow-up on abdominal pain and diarrhea. The patient was last seen in our office 05/06/2016 at which point she noted she was doing well overall. Started having diarrhea 3-4 weeks prior up to 8 loose/watery stools a day. No bleeding noted. Also with nausea and vomiting with last episode of emesis the day prior to her visit. States GERD well-controlled at that time. Query gastroenteritis versus irritable bowel syndrome versus acute GI infection. She was given stool cups for stool samples to check for C. difficile and GI pathogen panel. If negative, can send an Bentyl 10 mg 3 times a day for symptomatic relief. Also check BMP for electrolytes. Recommend follow-up in 4-6 weeks.  Ordered labs are still listed as "active" and do not appear to have been completed.  Today she states she's doing better. Diarrhea has stopped, but still with abdominal cramping. Abdominal cramping lower abdomen, normal bowel movements without diarrhea or constipation. Denies hematochezia, melena, other abdominal pain. Has a bowel movement 2-4 times daily. Denies chest pain, worsening dyspnea, dizziness, lightheadedness, syncope, near syncope. Denies any other upper or lower GI symptoms.  Past Medical History:  Diagnosis Date  . Amenorrhea 10/02/2014  . Broken leg    left  . Depression   . Irregular periods 05/13/2015  . Nausea 03/13/2015  . Obesity   . Pelvic pain in female 05/13/2015  . Pregnant 05/13/2015  . RLQ abdominal pain 10/02/2014  . Suicide ([place])   . Urinary frequency 03/13/2015    Past Surgical History:  Procedure Laterality Date  . ESOPHAGOGASTRODUODENOSCOPY N/A 12/12/2014   [id number] reflux     Current Outpatient Prescriptions  Medication Sig Dispense  Refill  . albuterol (PROAIR HFA) 108 (90 BASE) MCG/ACT inhaler Inhale into the lungs every 6 (six) hours as needed for wheezing or shortness of breath. Reported on 01/13/2016    . busPIRone (BUSPAR) 5 MG tablet Take 1 tablet (5 mg total) by mouth 2 (two) times daily. 60 tablet 3  . cetirizine (ZYRTEC) 10 MG tablet Take 1 tablet (10 mg total) by mouth daily. 30 tablet 2  . norethindrone (MICRONOR,CAMILA,ERRIN) 0.35 MG tablet Take 1 tablet (0.35 mg total) by mouth daily. 1 Package 11  . dicyclomine (BENTYL) 10 MG capsule Take 1 capsule (10 mg total) by mouth 4 (four) times daily -  before meals and at bedtime. AS NEEDED FOR ABDOMINAL PAIN AND/OR LOOSE STOOLS 90 capsule 0   No current facility-administered medications for this visit.     Allergies as of 06/24/2016 - Review Complete 06/24/2016  Allergen Reaction Noted  . Tylenol [acetaminophen] Other (See Comments) 09/14/2013    Family History  Problem Relation Age of Onset  . Hypertension Mother   . Stroke Paternal Aunt   . Diabetes Maternal Grandmother   . Heart disease Maternal Grandmother   . Hyperlipidemia Maternal Grandmother   . Drug abuse Maternal Grandmother   . Glaucoma Maternal Grandmother   . Cataracts Maternal Grandmother   . Cancer Paternal Grandfather   . Colon cancer Paternal Grandfather   . Psoriasis Sister   . Prostate cancer Maternal Grandfather   . Alcohol abuse Maternal Grandfather   . Other Father     heart issues  . Heart attack Father   .  Cervical cancer Maternal Aunt   . Physical abuse Maternal Uncle   . Obesity Other   . Bipolar disorder Other     paternal aunt and cousin  . Drug abuse Other     Social History   Social History  . Marital status: Single    Spouse name: N/A  . Number of children: N/A  . Years of education: N/A   Occupational History  . student     9th grade at [employer]   Social History Main Topics  . Smoking status: Former Smoker    Packs/day: 1.00    Years: 2.00    Types:  Cigarettes    Quit date: 11/27/2012  . Smokeless tobacco: Former Systems developer    Types: Chew     Comment: quit in 04/2015  . Alcohol use No  . Drug use: No  . Sexual activity: Yes    Partners: Male    Birth control/ protection: Pill   Other Topics Concern  . None   Social History Narrative  . None    Review of Systems: General: Negative for anorexia, weight loss, fever, chills, fatigue, weakness. CV: Negative for chest pain, angina, palpitations, peripheral edema.  Respiratory: Negative for dyspnea at rest, cough, sputum, wheezing.  GI: See history of present illness. Endo: Negative for unusual weight change.    Physical Exam: BP 119/66   Pulse 85   Temp 97.8 F (36.6 C) (Oral)   Ht 5\' 5"  (1.651 m)   Wt 244 lb (110.7 kg)   BMI 40.60 kg/m  General:   Obese female. Alert and oriented. Pleasant and cooperative. Well-nourished and well-developed.  Ears:  Normal auditory acuity. Cardiovascular:  S1, S2 present without murmurs appreciated. Normal pulses noted. Extremities without clubbing or edema. Respiratory:  Clear to auscultation bilaterally. No wheezes, rales, or rhonchi. No distress.  Gastrointestinal:  +BS, soft, non-tender and non-distended. No HSM noted. No guarding or rebound. No masses appreciated.  Rectal:  Deferred  Neurologic:  Alert and oriented x4;  grossly normal neurologically. Psych:  Alert and cooperative. Normal mood and affect. Heme/Lymph/Immune: No excessive bruising noted.    06/24/2016 11:10 AM   Disclaimer: This note was dictated with voice recognition software. Similar sounding words can inadvertently be transcribed and may not be corrected upon review.

## 2016-06-24 NOTE — Assessment & Plan Note (Signed)
Intermittent abdominal cramping which is relieved after bowel movement. No constipation or diarrhea, still has 2-4 bowel movements a day. I will trial her on Bentyl 10 mg 3 times a day and at bedtime as needed for abdominal cramping and/or diarrhea. Return for follow-up in 6 months. Call if any worsening symptoms before then.

## 2016-06-24 NOTE — Progress Notes (Signed)
CC'ED TO PCP 

## 2016-06-24 NOTE — Patient Instructions (Signed)
1. I have sent in a prescription for Bentyl 10 mg. You can take this up to 3 times a day and at bedtime, if needed for abdominal cramping and/or loose stools. 2. Return for follow-up in 6 months. 3. Call us if any worsening or returning symptoms.

## 2016-06-24 NOTE — Assessment & Plan Note (Signed)
Diarrhea has resolved. Likely self-limiting gastroenteritis. Continue to monitor, return for follow-up in 6 months.

## 2016-08-03 ENCOUNTER — Ambulatory Visit (INDEPENDENT_AMBULATORY_CARE_PROVIDER_SITE_OTHER): Payer: Medicaid Other | Admitting: Family Medicine

## 2016-08-03 ENCOUNTER — Encounter: Payer: Self-pay | Admitting: Family Medicine

## 2016-08-03 VITALS — BP 129/74 | HR 98 | Temp 98.6°F | Ht 65.0 in | Wt 244.6 lb

## 2016-08-03 DIAGNOSIS — Z3009 Encounter for other general counseling and advice on contraception: Secondary | ICD-10-CM

## 2016-08-03 DIAGNOSIS — N3001 Acute cystitis with hematuria: Secondary | ICD-10-CM | POA: Diagnosis not present

## 2016-08-03 DIAGNOSIS — IMO0001 Reserved for inherently not codable concepts without codable children: Secondary | ICD-10-CM

## 2016-08-03 DIAGNOSIS — E6609 Other obesity due to excess calories: Secondary | ICD-10-CM | POA: Diagnosis not present

## 2016-08-03 LAB — MICROSCOPIC EXAMINATION
Epithelial Cells (non renal): >10 /hpf — AB (ref 0–10)
RENAL EPITHEL UA: NONE SEEN /HPF

## 2016-08-03 LAB — URINALYSIS, COMPLETE
BILIRUBIN UA: NEGATIVE
GLUCOSE, UA: NEGATIVE
KETONES UA: NEGATIVE
Nitrite, UA: POSITIVE — AB
PH UA: 5.5 (ref 5.0–7.5)
PROTEIN UA: NEGATIVE
Specific Gravity, UA: <1.005 — ABNORMAL LOW (ref 1.005–1.030)
UUROB: 0.2 mg/dL (ref 0.2–1.0)

## 2016-08-03 LAB — BAYER DCA HB A1C WAIVED: HB A1C: 5.1 % (ref <7.0)

## 2016-08-03 MED ORDER — CIPROFLOXACIN HCL 250 MG PO TABS: 250.0000 mg | ORAL_TABLET | Freq: Two times a day (BID) | ORAL | 0 refills | Status: DC

## 2016-08-03 NOTE — Patient Instructions (Signed)
Great to see you!  Be sure to finish all antibiotics   Urinary Tract Infection, Adult A urinary tract infection (UTI) is an infection of any part of the urinary tract, which includes the kidneys, ureters, bladder, and urethra. These organs make, store, and get rid of urine in the body. UTI can be a bladder infection (cystitis) or kidney infection (pyelonephritis). What are the causes? This infection may be caused by fungi, viruses, or bacteria. Bacteria are the most common cause of UTIs. This condition can also be caused by repeated incomplete emptying of the bladder during urination. What increases the risk? This condition is more likely to develop if:  You ignore your need to urinate or hold urine for long periods of time.  You do not empty your bladder completely during urination.  You wipe back to front after urinating or having a bowel movement, if you are female.  You are uncircumcised, if you are female.  You are constipated.  You have a urinary catheter that stays in place (indwelling).  You have a weak defense (immune) system.  You have a medical condition that affects your bowels, kidneys, or bladder.  You have diabetes.  You take antibiotic medicines frequently or for long periods of time, and the antibiotics no longer work well against certain types of infections (antibiotic resistance).  You take medicines that irritate your urinary tract.  You are exposed to chemicals that irritate your urinary tract.  You are female. What are the signs or symptoms? Symptoms of this condition include:  Fever.  Frequent urination or passing small amounts of urine frequently.  Needing to urinate urgently.  Pain or burning with urination.  Urine that smells bad or unusual.  Cloudy urine.  Pain in the lower abdomen or back.  Trouble urinating.  Blood in the urine.  Vomiting or being less hungry than normal.  Diarrhea or abdominal pain.  Vaginal discharge, if you  are female. How is this diagnosed? This condition is diagnosed with a medical history and physical exam. You will also need to provide a urine sample to test your urine. Other tests may be done, including:  Blood tests.  Sexually transmitted disease (STD) testing. If you have had more than one UTI, a cystoscopy or imaging studies may be done to determine the cause of the infections. How is this treated? Treatment for this condition often includes a combination of two or more of the following:  Antibiotic medicine.  Other medicines to treat less common causes of UTI.  Over-the-counter medicines to treat pain.  Drinking enough water to stay hydrated. Follow these instructions at home:  Take over-the-counter and prescription medicines only as told by your health care provider.  If you were prescribed an antibiotic, take it as told by your health care provider. Do not stop taking the antibiotic even if you start to feel better.  Avoid alcohol, caffeine, tea, and carbonated beverages. They can irritate your bladder.  Drink enough fluid to keep your urine clear or pale yellow.  Keep all follow-up visits as told by your health care provider. This is important.  Make sure to:  Empty your bladder often and completely. Do not hold urine for long periods of time.  Empty your bladder before and after sex.  Wipe from front to back after a bowel movement if you are female. Use each tissue one time when you wipe. Contact a health care provider if:  You have back pain.  You have a fever.  You  feel nauseous or vomit.  Your symptoms do not get better after 3 days.  Your symptoms go away and then return. Get help right away if:  You have severe back pain or lower abdominal pain.  You are vomiting and cannot keep down any medicines or water. This information is not intended to replace advice given to you by your health care provider. Make sure you discuss any questions you have with  your health care provider. Document Released: 04/08/2005 Document Revised: 12/11/2015 Document Reviewed: 05/20/2015 Elsevier Interactive Patient Education  2017 [employer].

## 2016-08-03 NOTE — Progress Notes (Signed)
   HPI  Patient presents today with concern for UTI also states that she is fasting requesting blood sugar testing.  UTI symptoms 2 weeks of dysuria, improves with Azo, returns afterwards. Patient's used over-the-counter Monistat with no improvement. No fevers, chills, sweats. She's tolerating food and fluids normally. Complains of left-sided low back pain that's new.  Obesity Patient requesting blood sugar testing today, states she is fasting except for coffee with creamer  PMH: Smoking status noted ROS: Per HPI  Objective: BP 129/74   Pulse 98   Temp 98.6 F (37 C) (Oral)   Ht '5\' 5"'$  (1.651 m)   Wt 244 lb 9.6 oz (110.9 kg)   BMI 40.70 kg/m  Gen: NAD, alert, cooperative with exam HEENT: NCAT CV: RRR, good S1/S2, no murmur Resp: CTABL, no wheezes, non-labored Abd: Soft, mild tenderness to palpation of the suprapubic area, no CVA tenderness Ext: No edema, warm Neuro: Alert and oriented, No gross deficits  Assessment and plan:  # UTI Treat with 3 days of  Cipro, uncomplicated UTI with no signs of pyelonephritis  Urine culture pending  # Obesity Per her request on checking BMP and A1c, also added lipid panel. Therapeutic lifestyle changes.   Contraceptive counseling With Antibiotics recommended secondary protection (condoms) for 2 weeks.     Orders Placed This Encounter  Procedures  . Urine culture  . Urinalysis, Complete  . BMP8+EGFR  . CBC with Differential/Platelet  . Lipid panel  . Bayer DCA Hb A1c Waived    Meds ordered this encounter  Medications  . ciprofloxacin (CIPRO) 250 MG tablet    Sig: Take 1 tablet (250 mg total) by mouth 2 (two) times daily.    Dispense:  6 tablet    Refill:  0    Laroy Apple, MD [facility] 08/03/2016, 5:16 PM

## 2016-08-04 ENCOUNTER — Encounter: Payer: Self-pay | Admitting: Family Medicine

## 2016-08-04 ENCOUNTER — Encounter: Payer: Self-pay | Admitting: Adult Health

## 2016-08-04 ENCOUNTER — Telehealth: Payer: Self-pay | Admitting: Family Medicine

## 2016-08-04 LAB — CBC WITH DIFFERENTIAL/PLATELET
BASOS ABS: 0 10*3/uL (ref 0.0–0.2)
BASOS: 0 %
EOS (ABSOLUTE): 0.2 10*3/uL (ref 0.0–0.4)
Eos: 2 %
Hematocrit: 37.1 % (ref 34.0–46.6)
Hemoglobin: 12.5 g/dL (ref 11.1–15.9)
Immature Grans (Abs): 0 10*3/uL (ref 0.0–0.1)
Immature Granulocytes: 0 %
LYMPHS: 27 %
Lymphocytes Absolute: 2.1 10*3/uL (ref 0.7–3.1)
MCH: 27.4 pg (ref 26.6–33.0)
MCHC: 33.7 g/dL (ref 31.5–35.7)
MCV: 81 fL (ref 79–97)
MONOS ABS: 0.5 10*3/uL (ref 0.1–0.9)
Monocytes: 6 %
NEUTROS ABS: 5 10*3/uL (ref 1.4–7.0)
Neutrophils: 65 %
PLATELETS: 259 10*3/uL (ref 150–379)
RBC: 4.56 x10E6/uL (ref 3.77–5.28)
RDW: 15.7 % — AB (ref 12.3–15.4)
WBC: 7.9 10*3/uL (ref 3.4–10.8)

## 2016-08-04 LAB — BMP8+EGFR
BUN/Creatinine Ratio: 11 (ref 9–23)
BUN: 7 mg/dL (ref 6–20)
CALCIUM: 9.1 mg/dL (ref 8.7–10.2)
CHLORIDE: 103 mmol/L (ref 96–106)
CO2: 23 mmol/L (ref 18–29)
Creatinine, Ser: 0.61 mg/dL (ref 0.57–1.00)
GFR, EST AFRICAN AMERICAN: 151 mL/min/{1.73_m2} (ref >59)
GFR, EST NON AFRICAN AMERICAN: 131 mL/min/{1.73_m2} (ref >59)
Glucose: 105 mg/dL — ABNORMAL HIGH (ref 65–99)
POTASSIUM: 4 mmol/L (ref 3.5–5.2)
SODIUM: 141 mmol/L (ref 134–144)

## 2016-08-04 LAB — LIPID PANEL
CHOLESTEROL TOTAL: 126 mg/dL (ref 100–199)
Chol/HDL Ratio: 3 ratio units (ref 0.0–4.4)
HDL: 42 mg/dL (ref >39)
LDL Calculated: 72 mg/dL (ref 0–99)
Triglycerides: 62 mg/dL (ref 0–149)
VLDL Cholesterol Cal: 12 mg/dL (ref 5–40)

## 2016-08-04 NOTE — Telephone Encounter (Signed)
Attempted call, no answer. With patient's recent email about chest pain now is going to call and recommend evaluation and discuss her risk factors.  Will ask nursing to give her call the morning and recommend evaluation.  Laroy Apple, MD [facility] 08/04/2016, 5:30 PM

## 2016-08-05 ENCOUNTER — Ambulatory Visit (INDEPENDENT_AMBULATORY_CARE_PROVIDER_SITE_OTHER): Payer: Medicaid Other | Admitting: Family Medicine

## 2016-08-05 ENCOUNTER — Encounter: Payer: Self-pay | Admitting: Family Medicine

## 2016-08-05 VITALS — BP 107/72 | HR 79 | Temp 98.0°F | Ht 65.0 in | Wt 241.0 lb

## 2016-08-05 DIAGNOSIS — K2100 Gastro-esophageal reflux disease with esophagitis, without bleeding: Secondary | ICD-10-CM

## 2016-08-05 LAB — URINE CULTURE

## 2016-08-05 MED ORDER — GI COCKTAIL ~~LOC~~: 30.0000 mL | Freq: Once | ORAL | Status: AC

## 2016-08-05 MED ORDER — OMEPRAZOLE 20 MG PO CPDR: 20.0000 mg | DELAYED_RELEASE_CAPSULE | Freq: Every day | ORAL | 3 refills | Status: DC

## 2016-08-05 NOTE — Assessment & Plan Note (Signed)
Chest pressure and chest pain  In her central chest region.  She has had an ulcer before , not on treatment, will get her on teatment.

## 2016-08-05 NOTE — Telephone Encounter (Signed)
[email or web address]

## 2016-08-05 NOTE — Telephone Encounter (Signed)
Patient states she is still having chest pains. Earliest time patient could come in was tonight at 5:30 or states she would have to wait until 2/14. Appointment made tonight at 5:30. Patient aware and verbalizes understanding that if it gets worse she needs to go to the ER.

## 2016-08-05 NOTE — Progress Notes (Signed)
BP 107/72   Pulse 79   Temp 98 F (36.7 C) (Oral)   Ht 5\' 5"  (1.651 m)   Wt 241 lb (109.3 kg)   BMI 40.10 kg/m    Subjective:    Patient ID: Sonya Padilla, female    DOB: 1996-05-31, 21 y.o.   MRN: [id number]  HPI: Sonya Padilla is a 21 y.o. female presenting on 08/05/2016 for Hypertension and Chest Pain (Right, Mid, Constant 3 years )   HPI Chest pain Patient has been having chest pain in the center of her chest that's been constant and has been going on for the past 3 years. She does she was diagnosed with ulcers in about a year ago and cannot recall if this feels similar to that or not. He says she has been having a lot of belching and burping as well. She cannot associate any activity with increasing the pain are decreasing the pain. She does associate that it is worse overnight and early in the mornings before she eats but improves after meals. She has some nausea associated with the belching and burping. The pain does not radiate anywhere else. The pain is described as moderate  Relevant past medical, surgical, family and social history reviewed and updated as indicated. Interim medical history since our last visit reviewed. Allergies and medications reviewed and updated.  Review of Systems  Constitutional: Negative for chills and fever.  Respiratory: Negative for cough, chest tightness, shortness of breath and wheezing.   Cardiovascular: Positive for chest pain. Negative for leg swelling.  Gastrointestinal: Positive for abdominal pain and nausea. Negative for blood in stool, constipation, diarrhea and vomiting.  Genitourinary: Negative for difficulty urinating, dysuria and hematuria.  Musculoskeletal: Negative for back pain and gait problem.  Skin: Negative for rash.  Neurological: Negative for light-headedness and headaches.  Psychiatric/Behavioral: Negative for agitation and behavioral problems.  All other systems reviewed and are negative.   Per HPI unless  specifically indicated above     Objective:    BP 107/72   Pulse 79   Temp 98 F (36.7 C) (Oral)   Ht 5\' 5"  (1.651 m)   Wt 241 lb (109.3 kg)   BMI 40.10 kg/m   Wt Readings from Last 3 Encounters:  08/05/16 241 lb (109.3 kg)  08/03/16 244 lb 9.6 oz (110.9 kg)  06/24/16 244 lb (110.7 kg)    Physical Exam  Constitutional: She is oriented to person, place, and time. She appears well-developed and well-nourished. No distress.  Eyes: Conjunctivae are normal.  Cardiovascular: Normal rate, regular rhythm, normal heart sounds and intact distal pulses.   No murmur heard. Pulmonary/Chest: Effort normal and breath sounds normal. No respiratory distress. She has no wheezes. She has no rales. She exhibits tenderness (sternal pain and pressure).  Abdominal: Soft. Bowel sounds are normal. She exhibits no distension. There is tenderness (epigastric). There is no rebound and no guarding.  Musculoskeletal: Normal range of motion. She exhibits no edema or tenderness.  Neurological: She is alert and oriented to person, place, and time. Coordination normal.  Skin: Skin is warm and dry. No rash noted. She is not diaphoretic.  Psychiatric: She has a normal mood and affect. Her behavior is normal.  Nursing note and vitals reviewed.     Assessment & Plan:   Problem List Items Addressed This Visit      Digestive   Gastroesophageal reflux disease with esophagitis - Primary     Chest pressure and chest pain  In  her central chest region.  She has had an ulcer before , not on treatment, will get her on teatment.      Relevant Medications   gi cocktail (Maalox,Lidocaine,Donnatal)   omeprazole (PRILOSEC) 20 MG capsule       Follow up plan: Return if symptoms worsen or fail to improve.  Counseling provided for all of the vaccine components No orders of the defined types were placed in this encounter.   Caryl Pina, MD [facility] 08/05/2016, 6:00 PM

## 2016-08-26 ENCOUNTER — Encounter: Payer: Self-pay | Admitting: Family Medicine

## 2016-08-26 ENCOUNTER — Ambulatory Visit (INDEPENDENT_AMBULATORY_CARE_PROVIDER_SITE_OTHER): Payer: Medicaid Other | Admitting: Family Medicine

## 2016-08-26 VITALS — BP 108/64 | HR 70 | Temp 98.9°F | Ht 65.0 in | Wt 245.0 lb

## 2016-08-26 DIAGNOSIS — F419 Anxiety disorder, unspecified: Principal | ICD-10-CM

## 2016-08-26 DIAGNOSIS — K2100 Gastro-esophageal reflux disease with esophagitis, without bleeding: Secondary | ICD-10-CM

## 2016-08-26 DIAGNOSIS — F329 Major depressive disorder, single episode, unspecified: Secondary | ICD-10-CM

## 2016-08-26 DIAGNOSIS — F418 Other specified anxiety disorders: Secondary | ICD-10-CM

## 2016-08-26 DIAGNOSIS — F339 Major depressive disorder, recurrent, unspecified: Secondary | ICD-10-CM | POA: Insufficient documentation

## 2016-08-26 MED ORDER — SERTRALINE HCL 50 MG PO TABS: 50.0000 mg | ORAL_TABLET | Freq: Every day | ORAL | 1 refills | Status: DC

## 2016-08-26 MED ORDER — OMEPRAZOLE 20 MG PO CPDR: 40.0000 mg | DELAYED_RELEASE_CAPSULE | Freq: Every day | ORAL | 3 refills | Status: DC

## 2016-08-26 NOTE — Progress Notes (Signed)
BP 108/64   Pulse 70   Temp 98.9 F (37.2 C) (Oral)   Ht 5\' 5"  (1.651 m)   Wt 245 lb (111.1 kg)   BMI 40.77 kg/m    Subjective:    Patient ID: Sonya Padilla, female    DOB: 04/16/1996, 21 y.o.   MRN: [id number]  HPI: Sonya Padilla is a 21 y.o. female presenting on 08/26/2016 for Chest Pain (2 week recheck on acid reflux)   HPI Chest pain/GERD recheck Patient comes in for a two-week recheck on her chest pain/GERD. She was taking omeprazole 20 mg since the last time we saw her. She also had a GI cocktail. She said the GI cocktail resolved all of her pain but the omeprazole has not been controlling it sufficiently. She denies any nausea or vomiting or blood in her stool. The chest pain does sometimes radiate from her abdomen up into her chest and an described as a sharp sensation. She does get intermittently and it is worse prior to eating.  Anxiety and depression Patient would also like to talk about her anxiety and depression. She feels like it's been a significant issue in that she's been having a lot more anger episodes of been more irritable and having more mood swings. She also says that she is having trouble sleeping at night because her mind is racing and keeping her up. This is something that she has follow off and on throughout her life and was on medications for it some years ago but has not been recently. She just says that she feels like she is back to the point where she needs to try something to help with this. She denies any suicidal ideations or thoughts of hurting herself. Her husband and child are here with her and she feels like she has a good support system.  Relevant past medical, surgical, family and social history reviewed and updated as indicated. Interim medical history since our last visit reviewed. Allergies and medications reviewed and updated.  Review of Systems  Constitutional: Negative for chills and fever.  Respiratory: Negative for chest  tightness and shortness of breath.   Cardiovascular: Positive for chest pain. Negative for leg swelling.  Gastrointestinal: Positive for abdominal pain. Negative for blood in stool, constipation, diarrhea, nausea and vomiting.  Genitourinary: Negative for difficulty urinating and dysuria.  Musculoskeletal: Negative for back pain and gait problem.  Skin: Negative for rash.  Neurological: Negative for light-headedness and headaches.  Psychiatric/Behavioral: Positive for decreased concentration, dysphoric mood and sleep disturbance. Negative for agitation, behavioral problems, self-injury and suicidal ideas. The patient is nervous/anxious.   All other systems reviewed and are negative.   Per HPI unless specifically indicated above     Objective:    BP 108/64   Pulse 70   Temp 98.9 F (37.2 C) (Oral)   Ht 5\' 5"  (1.651 m)   Wt 245 lb (111.1 kg)   BMI 40.77 kg/m   Wt Readings from Last 3 Encounters:  08/26/16 245 lb (111.1 kg)  08/05/16 241 lb (109.3 kg)  08/03/16 244 lb 9.6 oz (110.9 kg)    Physical Exam  Constitutional: She is oriented to person, place, and time. She appears well-developed and well-nourished. No distress.  Eyes: Conjunctivae are normal.  Cardiovascular: Normal rate, regular rhythm, normal heart sounds and intact distal pulses.   No murmur heard. Pulmonary/Chest: Effort normal and breath sounds normal. No respiratory distress. She has no wheezes. She has no rales.  Abdominal: Soft. Bowel  sounds are normal. She exhibits no distension. There is tenderness (Epigastric abdominal tenderness, no CVA tenderness). There is no rebound and no guarding.  Musculoskeletal: Normal range of motion. She exhibits no edema or tenderness.  Neurological: She is alert and oriented to person, place, and time. Coordination normal.  Skin: Skin is warm and dry. No rash noted. She is not diaphoretic.  Psychiatric: Her behavior is normal. Her mood appears anxious. She exhibits a depressed  mood. She expresses no suicidal ideation. She expresses no suicidal plans.  Nursing note and vitals reviewed.     Assessment & Plan:   Problem List Items Addressed This Visit      Digestive   Gastroesophageal reflux disease with esophagitis   Relevant Medications   omeprazole (PRILOSEC) 20 MG capsule     Other   Anxiety and depression - Primary   Relevant Medications   sertraline (ZOLOFT) 50 MG tablet      Chart her on Zoloft and we'll see how that goes for her. She says the buspirone is just not enough for anxiety depression. Increase her omeprazole to 40 mg daily  Follow up plan: Return in about 4 weeks (around 09/23/2016), or if symptoms worsen or fail to improve, for Anxiety depression recheck.  Counseling provided for all of the vaccine components No orders of the defined types were placed in this encounter.   Caryl Pina, MD [facility] 08/26/2016, 4:14 PM

## 2016-09-30 ENCOUNTER — Ambulatory Visit (INDEPENDENT_AMBULATORY_CARE_PROVIDER_SITE_OTHER): Payer: Medicaid Other | Admitting: Family Medicine

## 2016-09-30 ENCOUNTER — Encounter: Payer: Self-pay | Admitting: Family Medicine

## 2016-09-30 VITALS — BP 122/61 | HR 95 | Temp 97.7°F | Ht 65.0 in | Wt 247.0 lb

## 2016-09-30 DIAGNOSIS — K2100 Gastro-esophageal reflux disease with esophagitis, without bleeding: Secondary | ICD-10-CM

## 2016-09-30 DIAGNOSIS — R197 Diarrhea, unspecified: Secondary | ICD-10-CM | POA: Diagnosis not present

## 2016-09-30 DIAGNOSIS — F3341 Major depressive disorder, recurrent, in partial remission: Secondary | ICD-10-CM

## 2016-09-30 MED ORDER — SERTRALINE HCL 100 MG PO TABS: 100.0000 mg | ORAL_TABLET | Freq: Every day | ORAL | 4 refills | Status: DC

## 2016-09-30 NOTE — Progress Notes (Signed)
BP 122/61   Pulse 95   Temp 97.7 F (36.5 C) (Oral)   Ht 5\' 5"  (1.651 m)   Wt 247 lb (112 kg)   LMP 09/16/2016   BMI 41.10 kg/m    Subjective:    Patient ID: Sonya Padilla, female    DOB: February 25, 1996, 21 y.o.   MRN: [id number]  HPI: Sonya Padilla is a 21 y.o. female presenting on 09/30/2016 for Anxiety (followup) and Depression   HPI Anxiety and depression recheck Patient is coming today for anxiety and depression recheck. She says she was doing pretty well on the Zoloft 50 mg but not completely well and would like to go up to the 100 mg dose. She denies any suicidal ideations. She isn't sleeping well at night. She denies any thoughts of harming herself. She still has mood swings to the day but they were much improved when she was on the Zoloft 50.  Diarrhea and constipation and acid reflux and abdominal discomfort and cramping Patient has been having diarrhea and constipation and acid reflux and abdominal discomfort and cramping that was on intermittently for quite some time. We have increased her omeprazole because most of her abdominal pain and cramping was in the epigastric region and she said it did numb the pain but then caused her to feel like she was getting backed up and not having bowel movements because she could not feel it and that she had urgently go. She backed off to omeprazole 20 mg daily again. She denies any blood in her stool. She has been having intermittent diarrhea and says she has up to 45 bowel movements per day.  Relevant past medical, surgical, family and social history reviewed and updated as indicated. Interim medical history since our last visit reviewed. Allergies and medications reviewed and updated.  Review of Systems  Constitutional: Negative for chills and fever.  Respiratory: Negative for chest tightness and shortness of breath.   Cardiovascular: Negative for chest pain and leg swelling.  Gastrointestinal: Positive for abdominal pain,  constipation and diarrhea. Negative for blood in stool, nausea and vomiting.  Genitourinary: Negative for difficulty urinating and dysuria.  Musculoskeletal: Negative for back pain and gait problem.  Skin: Negative for rash.  Neurological: Negative for light-headedness and headaches.  Psychiatric/Behavioral: Positive for decreased concentration, dysphoric mood and sleep disturbance. Negative for agitation, behavioral problems, self-injury and suicidal ideas. The patient is nervous/anxious.   All other systems reviewed and are negative.   Per HPI unless specifically indicated above   Depression screen [facility] 2/9 09/30/2016 08/26/2016 08/05/2016  Decreased Interest 0 0 0  Down, Depressed, Hopeless 0 0 0  PHQ - 2 Score 0 0 0        Objective:    BP 122/61   Pulse 95   Temp 97.7 F (36.5 C) (Oral)   Ht 5\' 5"  (1.651 m)   Wt 247 lb (112 kg)   LMP 09/16/2016   BMI 41.10 kg/m   Wt Readings from Last 3 Encounters:  09/30/16 247 lb (112 kg)  08/26/16 245 lb (111.1 kg)  08/05/16 241 lb (109.3 kg)    Physical Exam  Constitutional: She is oriented to person, place, and time. She appears well-developed and well-nourished. No distress.  Eyes: Conjunctivae are normal.  Cardiovascular: Normal rate, regular rhythm, normal heart sounds and intact distal pulses.   No murmur heard. Pulmonary/Chest: Effort normal and breath sounds normal. No respiratory distress. She has no wheezes. She has no rales.  Abdominal:  Soft. Bowel sounds are normal. She exhibits no distension. There is tenderness (Epigastric abdominal tenderness, no CVA tenderness). There is no rebound and no guarding.  Musculoskeletal: Normal range of motion. She exhibits no edema or tenderness.  Neurological: She is alert and oriented to person, place, and time. Coordination normal.  Skin: Skin is warm and dry. No rash noted. She is not diaphoretic.  Psychiatric: Her behavior is normal. Her mood appears anxious. She exhibits a depressed  mood. She expresses no suicidal ideation. She expresses no suicidal plans.  Nursing note and vitals reviewed.     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  Counseling provided for all of the vaccine components No orders of the defined types were placed in this encounter.   Caryl Pina, MD [facility] 09/30/2016, 1:09 PM

## 2016-10-07 ENCOUNTER — Ambulatory Visit: Payer: Medicaid Other | Admitting: Family Medicine

## 2016-11-10 ENCOUNTER — Encounter: Payer: Self-pay | Admitting: Family Medicine

## 2016-12-23 ENCOUNTER — Ambulatory Visit (INDEPENDENT_AMBULATORY_CARE_PROVIDER_SITE_OTHER): Payer: Medicaid Other | Admitting: Nurse Practitioner

## 2016-12-23 ENCOUNTER — Encounter: Payer: Self-pay | Admitting: Nurse Practitioner

## 2016-12-23 DIAGNOSIS — K580 Irritable bowel syndrome with diarrhea: Secondary | ICD-10-CM

## 2016-12-23 DIAGNOSIS — K589 Irritable bowel syndrome without diarrhea: Secondary | ICD-10-CM | POA: Insufficient documentation

## 2016-12-23 DIAGNOSIS — R079 Chest pain, unspecified: Secondary | ICD-10-CM

## 2016-12-23 NOTE — Progress Notes (Signed)
cc'ed to pcp °

## 2016-12-23 NOTE — Progress Notes (Signed)
Referring Provider: Timmothy Euler, MD Primary Care Physician:  Timmothy Euler, MD Primary GI:  Dr. Gala Romney  Chief Complaint  Patient presents with  . Abdominal Pain    HPI:   Sonya Padilla is a 21 y.o. female who presents for follow-up on diarrhea and abdominal pain. The patient was last seen in our office 06/24/2016 for the same. Prior to her last visit she was having 8 loose/watery stools a with nausea and vomiting, query gastroenteritis versus irritable bowel syndrome versus acute GI infection. Stool sample tests were ordered. Does not appear labs have been completed. At her last visit she was doing better, diarrhea stopped but still with abdominal cramping in her lower abdomen. Bowel movement 2-4 times daily, no other GI symptoms. Recommended Bentyl 10 mg up to 3 times a day for cramping or loose stools, return for follow-up in 6 months, call if any worsening symptoms. She had not called her office since then.  Today she states she's doing well overall. Diarrhea resolved. Still with lower abdominal pain, every time she has to have a bowel movement. Stools are a mix of normal and loose. Estimates 1-2/10 bowel movements are loose. Abdominal pain improves after bowel movement. States Bentyl helped for a couple weeks but then stopped. Denies hematochezia, melena, fever, chills, acute changes in bowel movements. Symptomatic GERD with pizza or spicy foods, otherwise well-controlled on PPI. Thinks she may have a hernia. Denies dyspnea, dizziness, lightheadedness, syncope, near syncope. Denies any other upper or lower GI symptoms.  States she's having chest pain currently, 5/10. Has it frequently. States she told her PCP a few months ago and they put her on Prilosec. She is no longer taking NSAIDs. Has not gone to an ER for evaluation.  Past Medical History:  Diagnosis Date  . Amenorrhea 10/02/2014  . Broken leg    left  . Depression   . Irregular periods 05/13/2015  . Nausea  03/13/2015  . Obesity   . Pelvic pain in female 05/13/2015  . Pregnant 05/13/2015  . RLQ abdominal pain 10/02/2014  . Suicide ([place])   . Urinary frequency 03/13/2015    Past Surgical History:  Procedure Laterality Date  . ESOPHAGOGASTRODUODENOSCOPY N/A 12/12/2014   [id number] reflux   . WISDOM TOOTH EXTRACTION Bilateral     Current Outpatient Prescriptions  Medication Sig Dispense Refill  . albuterol (PROAIR HFA) 108 (90 BASE) MCG/ACT inhaler Inhale into the lungs every 6 (six) hours as needed for wheezing or shortness of breath. Reported on 01/13/2016    . norethindrone (MICRONOR,CAMILA,ERRIN) 0.35 MG tablet Take 1 tablet (0.35 mg total) by mouth daily. 1 Package 11  . omeprazole (PRILOSEC) 20 MG capsule Take 2 capsules (40 mg total) by mouth daily. 60 capsule 3  . sertraline (ZOLOFT) 100 MG tablet Take 1 tablet (100 mg total) by mouth daily. 30 tablet 4   Current Facility-Administered Medications  Medication Dose Route Frequency Provider Last Rate Last Dose  . gi cocktail (Maalox,Lidocaine,Donnatal)  30 mL Oral Once Dettinger, Fransisca Kaufmann, MD        Allergies as of 12/23/2016 - Review Complete 12/23/2016  Allergen Reaction Noted  . Tylenol [acetaminophen] Other (See Comments) 09/14/2013    Family History  Problem Relation Age of Onset  . Hypertension Mother   . Stroke Paternal Aunt   . Diabetes Maternal Grandmother   . Heart disease Maternal Grandmother   . Hyperlipidemia Maternal Grandmother   . Drug abuse Maternal Grandmother   . Glaucoma  Maternal Grandmother   . Cataracts Maternal Grandmother   . Cancer Paternal Grandfather   . Colon cancer Paternal Grandfather   . Psoriasis Sister   . Prostate cancer Maternal Grandfather   . Alcohol abuse Maternal Grandfather   . Other Father        heart issues  . Heart attack Father   . Cervical cancer Maternal Aunt   . Physical abuse Maternal Uncle   . Obesity Other   . Bipolar disorder Other        paternal aunt and cousin    . Drug abuse Other     Social History   Social History  . Marital status: Single    Spouse name: N/A  . Number of children: N/A  . Years of education: N/A   Occupational History  . student     9th grade at [employer]   Social History Main Topics  . Smoking status: Former Smoker    Packs/day: 1.00    Years: 2.00    Types: Cigarettes    Quit date: 11/27/2012  . Smokeless tobacco: Former Systems developer    Types: Chew     Comment: quit in 04/2015  . Alcohol use No  . Drug use: No  . Sexual activity: Yes    Partners: Male    Birth control/ protection: Pill   Other Topics Concern  . None   Social History Narrative  . None    Review of Systems: General: Negative for anorexia, weight loss, fever, chills, fatigue, weakness. Eyes: Negative for vision changes.  ENT: Negative for hoarseness, difficulty swallowing , nasal congestion. CV: Negative for chest pain, angina, palpitations, dyspnea on exertion, peripheral edema.  Respiratory: Negative for dyspnea at rest, dyspnea on exertion, cough, sputum, wheezing.  GI: See history of present illness. GU:  Negative for dysuria, hematuria, urinary incontinence, urinary frequency, nocturnal urination.  MS: Negative for joint pain, low back pain.  Derm: Negative for rash or itching.  Neuro: Negative for weakness, abnormal sensation, seizure, frequent headaches, memory loss, confusion.  Psych: Negative for anxiety, depression, suicidal ideation, hallucinations.  Endo: Negative for unusual weight change.  Heme: Negative for bruising or bleeding. Allergy: Negative for rash or hives.   Physical Exam: BP 125/70   Pulse 83   Temp 97.5 F (36.4 C) (Oral)   Ht 5\' 5"  (1.651 m)   Wt 236 lb (107 kg)   BMI 39.27 kg/m  General:   Alert and oriented. Pleasant and cooperative. Well-nourished and well-developed.  Head:  Normocephalic and atraumatic. Eyes:  Without icterus, sclera clear and conjunctiva pink.  Ears:  Normal auditory acuity. Mouth:   No deformity or lesions, oral mucosa pink.  Throat/Neck:  Supple, without mass or thyromegaly. Cardiovascular:  S1, S2 present without murmurs appreciated. Normal pulses noted. Extremities without clubbing or edema. Respiratory:  Clear to auscultation bilaterally. No wheezes, rales, or rhonchi. No distress.  Gastrointestinal:  +BS, soft, non-tender and non-distended. No HSM noted. No guarding or rebound. No masses appreciated.  Rectal:  Deferred  Musculoskalatal:  Symmetrical without gross deformities. Normal posture. Skin:  Intact without significant lesions or rashes. Neurologic:  Alert and oriented x4;  grossly normal neurologically. Psych:  Alert and cooperative. Normal mood and affect. Heme/Lymph/Immune: No significant cervical adenopathy. No excessive bruising noted.    12/23/2016 9:32 AM   Disclaimer: This note was dictated with voice recognition software. Similar sounding words can inadvertently be transcribed and may not be corrected upon review.

## 2016-12-23 NOTE — Assessment & Plan Note (Signed)
Patient admits chest pain today. States she has chest pain frequently. She was told it was likely due to her ulcer "flaring back up." At her last visit she was given samples of Dexilant requested progress report. She did not call our office and let us know if it was helping. Today she states it did help some. She told her primary care about her chest pain and 8 started her on Prilosec daily. I recommended she go to an emergency room for evaluation given active chest pain and she declines at this point. Return for follow-up in 3 months. Notify primary care of continued chest pain despite 2-3 months of PPI therapy.

## 2016-12-23 NOTE — Assessment & Plan Note (Signed)
At this point her symptoms including lower abdominal cramping which improve after a bowel movement and diarrhea, although improved but persistent, are likely due to irritable bowel syndrome diarrhea type. At this point Bentyl did not work, however she did not call to tell us it was not working. I will trial her on a low dose of Viberzi 75 mg once a day. I've requested that she call us in 2 weeks and let us know if it is helping at all. Return for follow-up in 3 months.

## 2016-12-23 NOTE — Patient Instructions (Signed)
1. Continue taking Prilosec. 2. I'm giving the samples of Viberzi 75 mg. Take this once a only. 3. Call us in 2 weeks and let us know if it is helping your abdominal pain and occasional diarrhea. 4. Call your primary care and let them know that despite 2-3 months of acid blocker you're still having chest pain. 5. Return for follow-up in 3 months.

## 2017-01-06 ENCOUNTER — Ambulatory Visit (INDEPENDENT_AMBULATORY_CARE_PROVIDER_SITE_OTHER): Payer: Medicaid Other | Admitting: Family Medicine

## 2017-01-06 ENCOUNTER — Other Ambulatory Visit: Payer: Self-pay

## 2017-01-06 ENCOUNTER — Encounter: Payer: Self-pay | Admitting: Family Medicine

## 2017-01-06 ENCOUNTER — Encounter: Payer: Self-pay | Admitting: Adult Health

## 2017-01-06 VITALS — BP 102/67 | HR 80 | Temp 98.9°F | Ht 65.0 in | Wt 234.0 lb

## 2017-01-06 DIAGNOSIS — F419 Anxiety disorder, unspecified: Secondary | ICD-10-CM | POA: Diagnosis not present

## 2017-01-06 DIAGNOSIS — F329 Major depressive disorder, single episode, unspecified: Secondary | ICD-10-CM

## 2017-01-06 DIAGNOSIS — F4310 Post-traumatic stress disorder, unspecified: Secondary | ICD-10-CM

## 2017-01-06 DIAGNOSIS — K2100 Gastro-esophageal reflux disease with esophagitis, without bleeding: Secondary | ICD-10-CM

## 2017-01-06 DIAGNOSIS — F3341 Major depressive disorder, recurrent, in partial remission: Secondary | ICD-10-CM | POA: Diagnosis not present

## 2017-01-06 MED ORDER — SERTRALINE HCL 100 MG PO TABS: 100.0000 mg | ORAL_TABLET | Freq: Every day | ORAL | 3 refills | Status: DC

## 2017-01-06 MED ORDER — SUCRALFATE 1 G PO TABS: 1.0000 g | ORAL_TABLET | Freq: Three times a day (TID) | ORAL | 5 refills | Status: DC

## 2017-01-06 MED ORDER — ELUXADOLINE 75 MG PO TABS: 1.0000 | ORAL_TABLET | Freq: Every day | ORAL | 3 refills | Status: DC

## 2017-01-06 NOTE — Progress Notes (Signed)
BP 102/67   Pulse 80   Temp 98.9 F (37.2 C) (Oral)   Ht 5\' 5"  (1.651 m)   Wt 234 lb (106.1 kg)   BMI 38.94 kg/m    Subjective:    Patient ID: Sonya Padilla, female    DOB: July 29, 1995, 21 y.o.   MRN: [id number]  HPI: Sonya Padilla is a 21 y.o. female presenting on 01/06/2017 for Depression (3 mo)   HPI Anxiety and depression Patient is coming in for recheck of her anxiety and depression and PTSD. She is currently on Zoloft and feels like she is doing very well and is very happy with where she is at. Her husband is also with her here today and says that she is doing very well and is happy as well. She denies any suicidal ideations or major feelings of depression. She feels like she is herself again.  Depression screen [facility] 2/9 01/06/2017 09/30/2016 08/26/2016 08/05/2016 08/03/2016  Decreased Interest 0 0 0 0 0  Down, Depressed, Hopeless 0 0 0 0 0  PHQ - 2 Score 0 0 0 0 0    Chest pain and epigastric abdominal pain Patient has chest pain is described as burning and sharp when it goes up into her chest that starts from her abdomen works way up. She saw her gastroenterologist just last week and he thought it was not for GERD but she is having abdominal pain in that region which then goes up as described as burning. She is on omeprazole 40 twice a day. He gave her a medication for irritable bowel syndrome which she is trying but does not feel like is helping. She denies any blood in her stool or nausea or vomiting.  Relevant past medical, surgical, family and social history reviewed and updated as indicated. Interim medical history since our last visit reviewed. Allergies and medications reviewed and updated.  Review of Systems  Constitutional: Negative for chills and fever.  HENT: Negative for congestion, ear discharge and ear pain.   Eyes: Negative for redness and visual disturbance.  Respiratory: Negative for chest tightness and shortness of breath.   Cardiovascular: Positive  for chest pain. Negative for leg swelling.  Gastrointestinal: Positive for abdominal pain.  Genitourinary: Negative for difficulty urinating and dysuria.  Musculoskeletal: Negative for back pain and gait problem.  Skin: Negative for rash.  Neurological: Negative for light-headedness and headaches.  Psychiatric/Behavioral: Negative for agitation and behavioral problems.  All other systems reviewed and are negative.   Per HPI unless specifically indicated above        Objective:    BP 102/67   Pulse 80   Temp 98.9 F (37.2 C) (Oral)   Ht 5\' 5"  (1.651 m)   Wt 234 lb (106.1 kg)   BMI 38.94 kg/m   Wt Readings from Last 3 Encounters:  01/06/17 234 lb (106.1 kg)  12/23/16 236 lb (107 kg)  09/30/16 247 lb (112 kg)    Physical Exam  Constitutional: She is oriented to person, place, and time. She appears well-developed and well-nourished. No distress.  Eyes: Conjunctivae are normal.  Cardiovascular: Normal rate, regular rhythm, normal heart sounds and intact distal pulses.   No murmur heard. Pulmonary/Chest: Effort normal and breath sounds normal. No respiratory distress. She has no wheezes. She has no rales.  Abdominal: Soft. Bowel sounds are normal. She exhibits no distension. There is tenderness (Epigastric abdominal tenderness). There is no rebound and no guarding.  Musculoskeletal: Normal range of motion. She exhibits  no edema or tenderness.  Neurological: She is alert and oriented to person, place, and time. Coordination normal.  Skin: Skin is warm and dry. No rash noted. She is not diaphoretic.  Psychiatric: Her behavior is normal. Judgment normal. Her mood appears anxious. She exhibits a depressed mood. She expresses no suicidal ideation. She expresses no suicidal plans.  Nursing note and vitals reviewed.       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  Counseling provided for all of the vaccine components No orders of the defined types were placed in this encounter.   Caryl Pina, MD [facility] 01/06/2017, 3:00 PM

## 2017-01-06 NOTE — Telephone Encounter (Signed)
Pt is calling to see about get a Rx sent into [facility] in [place] for Viberzi 75 mg. She said that it is working.

## 2017-01-20 ENCOUNTER — Encounter: Payer: Self-pay | Admitting: Family Medicine

## 2017-01-25 ENCOUNTER — Encounter: Payer: Self-pay | Admitting: Adult Health

## 2017-02-17 ENCOUNTER — Ambulatory Visit (INDEPENDENT_AMBULATORY_CARE_PROVIDER_SITE_OTHER): Payer: Medicaid Other | Admitting: Family Medicine

## 2017-02-17 VITALS — BP 124/75 | HR 94 | Temp 98.4°F | Ht 64.5 in | Wt 237.2 lb

## 2017-02-17 DIAGNOSIS — M545 Low back pain, unspecified: Secondary | ICD-10-CM

## 2017-02-17 DIAGNOSIS — G453 Amaurosis fugax: Secondary | ICD-10-CM | POA: Diagnosis not present

## 2017-02-17 DIAGNOSIS — M25551 Pain in right hip: Secondary | ICD-10-CM | POA: Diagnosis not present

## 2017-02-17 DIAGNOSIS — M25552 Pain in left hip: Secondary | ICD-10-CM | POA: Diagnosis not present

## 2017-02-17 DIAGNOSIS — H539 Unspecified visual disturbance: Secondary | ICD-10-CM | POA: Diagnosis not present

## 2017-02-17 NOTE — Progress Notes (Signed)
HPI  Patient presents today here with back pain& vision changes Patient states that she's had a left eye vision loss described as a curtain being pulled down over her lasting 15-30 minutes almost daily basis for a few weeks. She's been seen by ophthalmology who states that her retinas are normal. And recommends further evaluation.  Back pain Patient with a few months of back pain, and started during pregnancy and then got worse after she gained a little bit of weight. Is worse lately with spending much time on her abdomen studying for school. She has not tried medication. She has not tried exercises. She does have some radiation down bilateral lower extremities and the back  PMH: Smoking status noted ROS: Per HPI  Objective: BP 124/75   Pulse 94   Temp 98.4 F (36.9 C) (Oral)   Ht 5' 4.5" (1.638 m)   Wt 237 lb 3.2 oz (107.6 kg)   BMI 40.09 kg/m  Gen: NAD, alert, cooperative with exam HEENT: NCAT, EOMI, PERRL CV: RRR, good S1/S2, no murmur Resp: CTABL, no wheezes, non-labored Abd: SNTND, BS present, no guarding or organomegaly Ext: No edema, warm Neuro: Alert and oriented, strength 5/5 and sensation intact in bilateral lower extremities MSK Positive Fabere and fadir testing bilateral hips, tenderness to palpation of bilateral lumbar paraspinal muscles, no midline tenderness  Assessment and plan:  # Lumbar back pain, bilateral hip pain Recommended scheduled NSAIDs 1-2 weeks, trial of physical therapy as well I believe this is mostly due to deconditioning Straight-leg raise was negative  # Vision changes Changes in the left eye are very suspicious for amaurosis fugax Recommended MRI in the next few days and neurology referral. Patient has been seen by optometry who states that there are no ocular findings to explain  Naproxen was sent withhand written script while EMR was down, after review I have called and told pt not to take as she has Hx of esophagitis. She will  try tylenol and PT Pt states she understands  Also now has new eval and states has swelling of the eyes, L >R.    Orders Placed This Encounter  Procedures  . MR Brain Wo Contrast    Standing Status:   Future    Standing Expiration Date:   04/19/2018    Order Specific Question:   What is the patient's sedation requirement?    Answer:   No Sedation    Order Specific Question:   Does the patient have a pacemaker or implanted devices?    Answer:   No    Order Specific Question:   Preferred imaging location?    Answer:   [facility] (table limit-350lbs)    Order Specific Question:   Radiology Contrast Protocol - do NOT remove file path    Answer:   [email or web address]  . Ambulatory referral to Neurology    Referral Priority:   Routine    Referral Type:   Consultation    Referral Reason:   Specialty Services Required    Requested Specialty:   Neurology    Number of Visits Requested:   1  . Ambulatory referral to Physical Therapy    Referral Priority:   Routine    Referral Type:   Physical Medicine    Referral Reason:   Specialty Services Required    Requested Specialty:   Physical Therapy    Number of Visits Requested:   [place], MD [facility] 02/18/2017,  11:47 AM

## 2017-02-18 ENCOUNTER — Telehealth: Payer: Self-pay | Admitting: Family Medicine

## 2017-02-18 ENCOUNTER — Encounter: Payer: Self-pay | Admitting: Family Medicine

## 2017-02-19 NOTE — Telephone Encounter (Signed)
Pt wants to know if vision changes could be due to hypothyroidism Pt states it runs in her family Please advise

## 2017-02-22 NOTE — Telephone Encounter (Signed)
It is unlikely due to thyroid dysfunction.   Laroy Apple, MD [facility] 02/22/2017, 7:42 AM

## 2017-02-24 ENCOUNTER — Other Ambulatory Visit: Payer: Self-pay | Admitting: Adult Health

## 2017-03-01 ENCOUNTER — Ambulatory Visit (HOSPITAL_COMMUNITY): Payer: Medicaid Other

## 2017-03-03 ENCOUNTER — Ambulatory Visit (HOSPITAL_COMMUNITY)
Admission: RE | Admit: 2017-03-03 | Discharge: 2017-03-03 | Disposition: A | Discharge status: Home / Self Care | Payer: Medicaid Other | Source: Ambulatory Visit | Attending: Family Medicine | Admitting: Family Medicine

## 2017-03-03 DIAGNOSIS — G453 Amaurosis fugax: Secondary | ICD-10-CM | POA: Diagnosis present

## 2017-03-03 DIAGNOSIS — D1802 Hemangioma of intracranial structures: Secondary | ICD-10-CM | POA: Diagnosis not present

## 2017-03-03 DIAGNOSIS — H539 Unspecified visual disturbance: Secondary | ICD-10-CM | POA: Diagnosis not present

## 2017-03-17 ENCOUNTER — Ambulatory Visit: Payer: Medicaid Other | Admitting: Physical Therapy

## 2017-03-24 ENCOUNTER — Ambulatory Visit: Payer: Medicaid Other | Admitting: Nurse Practitioner

## 2017-03-25 ENCOUNTER — Telehealth: Payer: Self-pay | Admitting: *Deleted

## 2017-03-25 NOTE — Telephone Encounter (Addendum)
Patient states she would like a tubal. Informed patient she needs to be at least 21 when she signs the paper and for her pre-op appt. Patient frustrated stating "why can't I come in November for my pre-op." Informed patient that Medicaid is very strict. Stated "ok".

## 2017-04-21 ENCOUNTER — Encounter: Payer: Self-pay | Admitting: Diagnostic Neuroimaging

## 2017-04-21 ENCOUNTER — Ambulatory Visit (INDEPENDENT_AMBULATORY_CARE_PROVIDER_SITE_OTHER): Payer: Medicaid Other | Admitting: Diagnostic Neuroimaging

## 2017-04-21 VITALS — BP 117/73 | HR 73 | Ht 65.0 in | Wt 234.8 lb

## 2017-04-21 DIAGNOSIS — R51 Headache: Secondary | ICD-10-CM

## 2017-04-21 DIAGNOSIS — H539 Unspecified visual disturbance: Secondary | ICD-10-CM

## 2017-04-21 DIAGNOSIS — R519 Headache, unspecified: Secondary | ICD-10-CM

## 2017-04-21 MED ORDER — TOPIRAMATE 50 MG PO TABS: 50.0000 mg | ORAL_TABLET | Freq: Two times a day (BID) | ORAL | 12 refills | Status: DC

## 2017-04-21 NOTE — Progress Notes (Signed)
[facility]  PATIENT: Sonya Padilla DOB: [birth year]  REFERRING CLINICIAN: Alen Bleacher, MD HISTORY FROM: patient and SO REASON FOR VISIT: new consult    HISTORICAL  CHIEF COMPLAINT:  Chief Complaint  Patient presents with  . Amaurosis fugax    rm 6, New pt, sig other -Matt, "MRI brain 03/03/17, told to come see you; vision still goes in and out blurry"    HISTORY OF PRESENT ILLNESS:   21 year old right-handed female here for evaluation of transient visual loss. For past 4 months patient has had intermittent episodes of left eye vision loss, lasting 15-30 minutes at a time, typically in the evening. This is usually followed by a headache on the left side. These attacks occurred for 3 months in a row. For the past 1 month she has not had any attacks. Patient had an eye exam which was unremarkable. She saw PCP who ordered MRI of the brain. 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  No prior history of headaches. No family history of migraine. Patient has baseline weight of 200, went up to 260 pounds during pregnancy, and now weighs 230 pounds.    REVIEW OF SYSTEMS: Full 14 system review of systems performed and negative with exception of: Headache feeling cold increased thirst cramps blurred vision loss of vision eye pain chest pain.  ALLERGIES: Allergies  Allergen Reactions  . Tylenol [Acetaminophen] Other (See Comments)    Migraine    HOME MEDICATIONS: Outpatient Medications Prior to Visit  Medication Sig Dispense Refill  . norethindrone (MICRONOR,CAMILA,ERRIN) 0.35 MG tablet TAKE ONE TABLET BY MOUTH ONCE DAILY 28 tablet 11  . Eluxadoline (VIBERZI) 75 MG TABS Take 1 tablet by mouth daily with breakfast. (Patient not taking: Reported on 04/21/2017) 90 tablet 3  . omeprazole (PRILOSEC) 20 MG capsule Take 2 capsules (40 mg total) by mouth daily. (Patient not taking: Reported on  04/21/2017) 60 capsule 3  . sertraline (ZOLOFT) 100 MG tablet Take 1 tablet (100 mg total) by mouth daily. (Patient not taking: Reported on 04/21/2017) 90 tablet 3  . sucralfate (CARAFATE) 1 g tablet Take 1 tablet (1 g total) by mouth 4 (four) times daily -  with meals and at bedtime. (Patient not taking: Reported on 04/21/2017) 90 tablet 5  . albuterol (PROAIR HFA) 108 (90 BASE) MCG/ACT inhaler Inhale into the lungs every 6 (six) hours as needed for wheezing or shortness of breath. Reported on 01/13/2016     Facility-Administered Medications Prior to Visit  Medication Dose Route Frequency Provider Last Rate Last Dose  . gi cocktail (Maalox,Lidocaine,Donnatal)  30 mL Oral Once Dettinger, Fransisca Kaufmann, MD        PAST MEDICAL HISTORY: Past Medical History:  Diagnosis Date  . Amenorrhea 10/02/2014  . Broken leg    left  . Depression   . Irregular periods 05/13/2015  . Nausea 03/13/2015  . Obesity   . Pelvic pain in female 05/13/2015  . Pregnant 05/13/2015  . RLQ abdominal pain 10/02/2014  . Suicide ([place])   . Urinary frequency 03/13/2015    PAST SURGICAL HISTORY: Past Surgical History:  Procedure Laterality Date  . ESOPHAGOGASTRODUODENOSCOPY N/A 12/12/2014   ZJI:[id number] reflux   . WISDOM TOOTH EXTRACTION Bilateral     FAMILY HISTORY: Family History  Problem Relation Age of Onset  . Hypertension Mother   . Stroke Paternal Aunt   . Diabetes Maternal Grandmother   .  Heart disease Maternal Grandmother   . Hyperlipidemia Maternal Grandmother   . Drug abuse Maternal Grandmother   . Glaucoma Maternal Grandmother   . Cataracts Maternal Grandmother   . Cancer Paternal Grandfather   . Colon cancer Paternal Grandfather   . Psoriasis Sister   . Prostate cancer Maternal Grandfather   . Alcohol abuse Maternal Grandfather   . Other Father        heart issues  . Heart attack Father   . Cervical cancer Maternal Aunt   . Physical abuse Maternal Uncle   . Obesity Other   . Bipolar disorder  Other        paternal aunt and cousin  . Drug abuse Other     SOCIAL HISTORY:  Social History   Social History  . Marital status: Single    Spouse name: N/A  . Number of children: 2  . Years of education: GED   Occupational History  .      NA   Social History Main Topics  . Smoking status: Former Smoker    Packs/day: 0.50    Years: 2.00    Types: Cigarettes    Quit date: 11/27/2012  . Smokeless tobacco: Former Systems developer    Types: Chew     Comment: quit in 04/2015  . Alcohol use No  . Drug use: No  . Sexual activity: Yes    Partners: Male    Birth control/ protection: Pill   Other Topics Concern  . Not on file   Social History Narrative   Lives with sig other, son, daughter   Caffeine- coffee, 4- 16 oz daily     PHYSICAL EXAM  GENERAL EXAM/CONSTITUTIONAL: Vitals:  Vitals:   04/21/17 1450  BP: 117/73  Pulse: 73  Weight: 234 lb 12.8 oz (106.5 kg)  Height: 5\' 5"  (1.651 m)     Body mass index is 39.07 kg/m.  Visual Acuity Screening   Right eye Left eye Both eyes  Without correction: 20/20 20/20   With correction:        Patient is in no distress; well developed, nourished and groomed; neck is supple  CARDIOVASCULAR:  Examination of carotid arteries is normal; no carotid bruits  Regular rate and rhythm, no murmurs  Examination of peripheral vascular system by observation and palpation is normal  EYES:  Ophthalmoscopic exam of optic discs and posterior segments is normal; no hemorrhages  MUSCULOSKELETAL:  Gait, strength, tone, movements noted in Neurologic exam below  NEUROLOGIC: MENTAL STATUS:  No flowsheet data found.  awake, alert, oriented to person, place and time  recent and remote memory intact  normal attention and concentration  language fluent, comprehension intact, naming intact,   fund of knowledge appropriate  CRANIAL NERVE:   2nd - SLIGHT BLURRED DISC MARGINS  2nd, 3rd, 4th, 6th - pupils equal and reactive to light,  visual fields full to confrontation, extraocular muscles intact, no nystagmus  5th - facial sensation symmetric  7th - facial strength symmetric  8th - hearing intact  9th - palate elevates symmetrically, uvula midline  11th - shoulder shrug symmetric  12th - tongue protrusion midline  MOTOR:   normal bulk and tone, full strength in the BUE, BLE  SENSORY:   normal and symmetric to light touch, temperature, vibration  COORDINATION:   finger-nose-finger, fine finger movements normal  REFLEXES:   deep tendon reflexes present and symmetric  GAIT/STATION:   narrow based gait    DIAGNOSTIC DATA (LABS, IMAGING, TESTING) - I  reviewed patient records, labs, notes, testing and imaging myself where available.  Lab Results  Component Value Date   WBC 7.9 08/03/2016   HGB 12.5 08/03/2016   HCT 37.1 08/03/2016   MCV 81 08/03/2016   PLT 259 08/03/2016      Component Value Date/Time   NA 141 08/03/2016 1649   K 4.0 08/03/2016 1649   CL 103 08/03/2016 1649   CO2 23 08/03/2016 1649   GLUCOSE 105 (H) 08/03/2016 1649   GLUCOSE 78 11/28/2014 1450   BUN 7 08/03/2016 1649   CREATININE 0.61 08/03/2016 1649   CREATININE 0.77 11/28/2014 1450   CALCIUM 9.1 08/03/2016 1649   PROT 5.5 (L) 01/13/2016 1508   ALBUMIN 3.2 (L) 01/13/2016 1508   AST 15 01/13/2016 1508   ALT 13 01/13/2016 1508   ALKPHOS 164 (H) 01/13/2016 1508   BILITOT 0.3 01/13/2016 1508   GFRNONAA 131 08/03/2016 1649   GFRAA 151 08/03/2016 1649   Lab Results  Component Value Date   CHOL 126 08/03/2016   HDL 42 08/03/2016   LDLCALC 72 08/03/2016   TRIG 62 08/03/2016   CHOLHDL 3.0 08/03/2016   No results found for: HGBA1C No results found for: VITAMINB12 Lab Results  Component Value Date   TSH 2.710 03/13/2015    03/03/17 MRI brain [I reviewed images myself and agree with interpretation. -VRP]  - Normal examination. Single exception is an incidental venous angioma in the right frontal lobe without  evidence of previous hemorrhage, not likely to be of any clinical significance. No abnormality seen to explain the presenting symptoms.      ASSESSMENT AND PLAN  21 y.o. year old female here with New onset transient left eye visual loss associated with headache. Will proceed with further workup. May represent pseudotumor cerebri vs migraine variant. TIA less likely.    Dx: pseudotumor cerebri vs migraine variant vs carotid stenosis / TIA  1. Transient vision disturbance of left eye   2. Nonintractable episodic headache, unspecified headache type      PLAN:  - check lumbar puncture (measure opening pressure) - check carotid u/s - start topiramate 50mg  at bedtime; after 1-2 weeks increase to twice a day   Orders Placed This Encounter  Procedures  . DG FLUORO GUIDED LOC OF NEEDLE/CATH TIP FOR SPINAL INJECT LT    Meds ordered this encounter  Medications  . topiramate (TOPAMAX) 50 MG tablet    Sig: Take 1 tablet (50 mg total) by mouth 2 (two) times daily.    Dispense:  60 tablet    Refill:  12   Return in about 3 months (around 07/22/2017).  I reviewed images, labs, notes, records myself. I summarized findings and reviewed with patient, for this high risk condition (transient vison loss) requiring high complexity decision making.    Penni Bombard, MD 02/58/5277, 8:24 PM Certified in Neurology, Neurophysiology and Neuroimaging  [facility] [address] [place], [place] [address] [phone]

## 2017-04-21 NOTE — Patient Instructions (Signed)
Thank you for coming to see [place] at [facility]. I hope we have been able to provide you high quality care today.  You may receive a patient satisfaction survey over the next few weeks. We would appreciate your feedback and comments so that we may continue to improve ourselves and the health of our patients.   - check lumbar puncture (measure opening pressure)  - check carotid ultrasound   - start topiramate 75m at bedtime; after 1-2 weeks increase to twice a day     ~~~~~~~~~~~~~~~~~~~~~~~~~~~~~~~~~~~~~~~~~~~~~~~~~~~~~~~~~~~~~~~~~  DR. PENUMALLI'S GUIDE TO HAPPY AND HEALTHY LIVING These are some of my general health and wellness recommendations. Some of them may apply to you better than others. Please use common sense as you try these suggestions and feel free to ask me any questions.   ACTIVITY/FITNESS Mental, social, emotional and physical stimulation are very important for brain and body health. Try learning a new activity (arts, music, language, sports, games).  Keep moving your body to the best of your abilities. You can do this at home, inside or outside, the park, community center, gym or anywhere you like. Consider a physical therapist or personal trainer to get started. Consider the app Sworkit. Fitness trackers such as smart-watches, smart-phones or Fitbits can help as well.   NUTRITION Eat more plants: colorful vegetables, nuts, seeds and berries.  Eat less sugar, salt, preservatives and processed foods.  Avoid toxins such as cigarettes and alcohol.  Drink water when you are thirsty. Warm water with a slice of lemon is an excellent morning drink to start the day.  Consider these websites for more information The Nutrition Source (hhttps://www.henry-hernandez.biz/ Precision Nutrition (wWindowBlog.ch   RELAXATION Consider practicing mindfulness meditation or other relaxation techniques such as deep  breathing, prayer, yoga, tai chi, massage. See website mindful.org or the apps Headspace or Calm to help get started.   SLEEP Try to get at least 7-8+ hours sleep per day. Regular exercise and reduced caffeine will help you sleep better. Practice good sleep hygeine techniques. See website sleep.org for more information.   PLANNING Prepare estate planning, living will, healthcare POA documents. Sometimes this is best planned with the help of an attorney. Theconversationproject.org and agingwithdignity.org are excellent resources.

## 2017-05-19 ENCOUNTER — Telehealth: Payer: Self-pay | Admitting: Diagnostic Neuroimaging

## 2017-05-19 ENCOUNTER — Ambulatory Visit
Admission: RE | Admit: 2017-05-19 | Discharge: 2017-05-19 | Disposition: A | Discharge status: Home / Self Care | Payer: Medicaid Other | Source: Ambulatory Visit | Attending: Diagnostic Neuroimaging | Admitting: Diagnostic Neuroimaging

## 2017-05-19 VITALS — BP 121/65 | HR 73

## 2017-05-19 DIAGNOSIS — R519 Headache, unspecified: Secondary | ICD-10-CM

## 2017-05-19 DIAGNOSIS — H539 Unspecified visual disturbance: Secondary | ICD-10-CM

## 2017-05-19 DIAGNOSIS — R51 Headache: Principal | ICD-10-CM

## 2017-05-19 LAB — CSF CELL COUNT WITH DIFFERENTIAL
Basophils, %: 0 %
Eosinophils, CSF: 0 %
Lymphs, CSF: 96 % — ABNORMAL HIGH (ref 40–80)
Monocyte/Macrophage: 4 % — ABNORMAL LOW (ref 15–45)
RBC COUNT CSF: 41 {cells}/uL — AB (ref 0–10)
SEGMENTED NEUTROPHILS-CSF: 0 % (ref 0–6)
WBC CSF: 7 {cells}/uL — AB (ref 0–5)

## 2017-05-19 LAB — PROTEIN, CSF: Total Protein, CSF: 70 mg/dL — ABNORMAL HIGH (ref 15–45)

## 2017-05-19 LAB — GLUCOSE, CSF: Glucose, CSF: 57 mg/dL (ref 40–80)

## 2017-05-19 NOTE — Telephone Encounter (Signed)
CSF results T protein 70, glucose normal.  Other pending.

## 2017-05-19 NOTE — Discharge Instructions (Signed)

## 2017-05-19 NOTE — Telephone Encounter (Signed)
Candi B/[facility] Dx Results [phone] Ref# [id number] D called she is faxing the results that are available now. She will fax remainder when it becomes available

## 2017-05-20 NOTE — Telephone Encounter (Signed)
Received lab results placed on Dr. Gladstone Lighter desk.

## 2017-05-21 NOTE — Telephone Encounter (Signed)
I called patient. No answer.  CSF results reviewed. Opening pressure normal. Therefore, no supporting evidence for pseudotumor cerebri.  CSF also shows non-specific elevation of protein, with WBC 7 (lyphocyte predominant). Unclear etiology. Could be resolving inflamm or resolving viral infx.   Penni Bombard, MD 32/08/252, 2:70 PM Certified in Neurology, Neurophysiology and Neuroimaging  [facility] [address], [place],  [address] [phone]

## 2017-05-24 NOTE — Telephone Encounter (Signed)
Patient returned call and I spoke with her and reviewed Dr. Gladstone Lighter result notes. She states that none of her results look normal to her. She is questioning the RBCs, WBCs, and protein. She doesn't understand and would like to speak with Dr. Leta Baptist.

## 2017-05-24 NOTE — Telephone Encounter (Signed)
I tried calling patient. No answer. Will try again tomorrow. -VRP

## 2017-05-24 NOTE — Telephone Encounter (Signed)
Pt called back, she called GI to let them know of her symptoms and was advised to call here to see if epidural patch could be ordered.

## 2017-05-24 NOTE — Telephone Encounter (Signed)
Patient returned call and requested to speak with someone regarding the results. Please call and advise.

## 2017-05-25 NOTE — Telephone Encounter (Signed)
I called patient.  Since her spinal tap on 05/19/17, she has had increased headaches.  This is not positional.  Headaches are present whether she is laying down or standing up.  Last night she had some improvement in headache and was able to take a shower.  However today her headache has returned.  She does report a low-grade temperature of 100 F.  She has been having some nausea and vomiting.  The patient denies any infection symptoms in the last 3-4 months when her vision problems had begun.  She had no infection symptoms on the day of her spinal tap.  At this point pseudotumor cerebri / IIH has been ruled out on the basis of normal opening pressure.  However unexpectedly patient has had a lymphocytic predominant leukocytosis in the CSF (WBC 7) with elevated protein of 70.  This could represent a resolving viral infection or other inflammatory reaction.  I would recommend to repeat the CSF studies in 4-[redacted] weeks along with MRI of the brain with and without contrast.  If patient has any further progression of symptoms in the next few days or few weeks then I would recommend patient go to the emergency room for more urgent evaluation, MRI brain and lumbar puncture.  Patient understands and agrees with this plan.  Penni Bombard, MD 38/04/1750, 0:25 PM Certified in Neurology, Neurophysiology and Neuroimaging  [facility] [address] [place], [place] [address] [phone]

## 2017-05-27 ENCOUNTER — Encounter: Payer: Self-pay | Admitting: Family Medicine

## 2017-06-09 ENCOUNTER — Ambulatory Visit (INDEPENDENT_AMBULATORY_CARE_PROVIDER_SITE_OTHER): Payer: Medicaid Other | Admitting: Family Medicine

## 2017-06-09 ENCOUNTER — Encounter: Payer: Self-pay | Admitting: Family Medicine

## 2017-06-09 VITALS — BP 124/74 | HR 98 | Temp 97.8°F | Ht 65.0 in | Wt 228.4 lb

## 2017-06-09 DIAGNOSIS — R51 Headache: Secondary | ICD-10-CM

## 2017-06-09 DIAGNOSIS — R7309 Other abnormal glucose: Secondary | ICD-10-CM

## 2017-06-09 DIAGNOSIS — R519 Headache, unspecified: Secondary | ICD-10-CM

## 2017-06-09 LAB — BAYER DCA HB A1C WAIVED: HB A1C (BAYER DCA - WAIVED): 5.1 % (ref <7.0)

## 2017-06-09 NOTE — Patient Instructions (Signed)
Great to see you!  Come back as needed.   I would highly recommend you follow up with your neurologist and consider the repeat testing. MRI's do not provide any radiation and are a safe test.

## 2017-06-09 NOTE — Progress Notes (Signed)
   HPI  Patient presents today for follow-up of headaches and abnormal spinal tap.  Patient recently had a lumbar puncture showing signs of resolving viral meningitis versus inflammatory process.  She states that her headaches continue, however she has not had any symptoms of being ill.  She denies fever, chills, sweats, or other concerns.  She is very nervous about a repeat lumbar puncture and states that she does not want to go through it.  She comes in requesting labs today instead.  I have recommended that she follow-up with her neurologist and explained that labs from her peripheral blood draw that are not a good replacement for repeat spinal tap.  Repeat MRI due to concerns about repeated radiation exposure, I have explained that MRIs do not provide any radiation exposure.  PMH: Smoking status noted ROS: Per HPI  Objective: BP 124/74   Pulse 98   Temp 97.8 F (36.6 C) (Oral)   Ht _0  (1.651 m)   Wt 228 lb 6.4 oz (103.6 kg)   LMP 05/10/2017   BMI 38.01 kg/m  Gen: NAD, alert, cooperative with exam HEENT: NCAT CV: RRR, good S1/S2, no murmur Resp: CTABL, no wheezes, non-labored Ext: No edema, warm Neuro: Alert and oriented, No gross deficits  Assessment and plan:  #Headache Headaches have not changed, patient had an LP consistent with resolving viral meningitis for inflammatory process Her request I have drawn basic labs today, however I have explained  #Elevated glucose Patient with previously elevated glucose, A1c of the time was normal. Repeating labs today    Orders Placed This Encounter  Procedures  . CBC with Differential/Platelet  . CMP14+EGFR  . Bayer DCA Hb A1c Waived    No orders of the defined types were placed in this encounter.   Laroy Apple, MD [facility] 06/09/2017, 2:46 PM

## 2017-06-10 LAB — CBC WITH DIFFERENTIAL/PLATELET
BASOS ABS: 0 10*3/uL (ref 0.0–0.2)
Basos: 0 %
EOS (ABSOLUTE): 0.4 10*3/uL (ref 0.0–0.4)
Eos: 4 %
Hematocrit: 42 % (ref 34.0–46.6)
Hemoglobin: 14.3 g/dL (ref 11.1–15.9)
Immature Grans (Abs): 0 10*3/uL (ref 0.0–0.1)
Immature Granulocytes: 0 %
LYMPHS ABS: 2.8 10*3/uL (ref 0.7–3.1)
LYMPHS: 34 %
MCH: 28.8 pg (ref 26.6–33.0)
MCHC: 34 g/dL (ref 31.5–35.7)
MCV: 85 fL (ref 79–97)
MONOS ABS: 0.8 10*3/uL (ref 0.1–0.9)
Monocytes: 9 %
NEUTROS ABS: 4.3 10*3/uL (ref 1.4–7.0)
Neutrophils: 53 %
PLATELETS: 246 10*3/uL (ref 150–379)
RBC: 4.96 x10E6/uL (ref 3.77–5.28)
RDW: 14.4 % (ref 12.3–15.4)
WBC: 8.3 10*3/uL (ref 3.4–10.8)

## 2017-06-10 LAB — CMP14+EGFR
A/G RATIO: 1.8 (ref 1.2–2.2)
ALK PHOS: 105 IU/L (ref 39–117)
ALT: 14 IU/L (ref 0–32)
AST: 13 IU/L (ref 0–40)
Albumin: 4.4 g/dL (ref 3.5–5.5)
BILIRUBIN TOTAL: 0.3 mg/dL (ref 0.0–1.2)
BUN/Creatinine Ratio: 10 (ref 9–23)
BUN: 7 mg/dL (ref 6–20)
CHLORIDE: 108 mmol/L — AB (ref 96–106)
CO2: 19 mmol/L — ABNORMAL LOW (ref 20–29)
Calcium: 9.3 mg/dL (ref 8.7–10.2)
Creatinine, Ser: 0.72 mg/dL (ref 0.57–1.00)
GFR calc Af Amer: 139 mL/min/{1.73_m2} (ref >59)
GFR calc non Af Amer: 121 mL/min/{1.73_m2} (ref >59)
GLUCOSE: 105 mg/dL — AB (ref 65–99)
Globulin, Total: 2.5 g/dL (ref 1.5–4.5)
POTASSIUM: 4 mmol/L (ref 3.5–5.2)
Sodium: 140 mmol/L (ref 134–144)
Total Protein: 6.9 g/dL (ref 6.0–8.5)

## 2017-06-23 ENCOUNTER — Ambulatory Visit: Payer: Medicaid Other | Admitting: Family Medicine

## 2017-06-23 ENCOUNTER — Ambulatory Visit: Payer: Medicaid Other | Admitting: Obstetrics and Gynecology

## 2017-06-23 ENCOUNTER — Encounter: Payer: Self-pay | Admitting: Family Medicine

## 2017-06-23 ENCOUNTER — Encounter: Payer: Self-pay | Admitting: Obstetrics and Gynecology

## 2017-06-23 VITALS — BP 120/75 | HR 90 | Temp 97.5°F | Ht 65.0 in | Wt 227.0 lb

## 2017-06-23 VITALS — BP 108/60 | HR 103 | Ht 65.0 in | Wt 224.8 lb

## 2017-06-23 DIAGNOSIS — F4310 Post-traumatic stress disorder, unspecified: Secondary | ICD-10-CM

## 2017-06-23 DIAGNOSIS — F339 Major depressive disorder, recurrent, unspecified: Secondary | ICD-10-CM | POA: Diagnosis not present

## 2017-06-23 DIAGNOSIS — Z3009 Encounter for other general counseling and advice on contraception: Secondary | ICD-10-CM

## 2017-06-23 DIAGNOSIS — N926 Irregular menstruation, unspecified: Secondary | ICD-10-CM

## 2017-06-23 NOTE — Progress Notes (Signed)
BP 120/75   Pulse 90   Temp (!) 97.5 F (36.4 C) (Oral)   Ht 5\' 5"  (1.651 m)   Wt 227 lb (103 kg)   LMP 06/09/2017   BMI 37.77 kg/m    Subjective:    Patient ID: Sonya Padilla, female    DOB: [birth year], 21 y.o.   MRN: [id number]  HPI: Sonya Padilla is a 21 y.o. female presenting on 06/23/2017 for Depression (6 mo follow up)   HPI Depression and PTSD recheck Patient is coming in for recheck of her depression and PTSD.  She had been on Zoloft but stopped it about a month ago and says that she has been doing very well and does not feel like she needs any further medication.  Her husband is with here here today and agrees. Depression screen [facility] 2/9 06/23/2017 06/09/2017 02/18/2017 01/06/2017 09/30/2016  Decreased Interest 0 0 0 0 0  Down, Depressed, Hopeless 0 0 0 0 0  PHQ - 2 Score 0 0 0 0 0     Relevant past medical, surgical, family and social history reviewed and updated as indicated. Interim medical history since our last visit reviewed. Allergies and medications reviewed and updated.  Review of Systems  Constitutional: Negative for chills and fever.  Eyes: Negative for visual disturbance.  Respiratory: Negative for chest tightness and shortness of breath.   Cardiovascular: Negative for chest pain and leg swelling.  Musculoskeletal: Negative for back pain and gait problem.  Skin: Negative for rash.  Neurological: Negative for light-headedness and headaches.  Psychiatric/Behavioral: Negative for agitation, behavioral problems, decreased concentration, dysphoric mood, self-injury, sleep disturbance and suicidal ideas. The patient is not nervous/anxious.   All other systems reviewed and are negative.   Per HPI unless specifically indicated above        Objective:    BP 120/75   Pulse 90   Temp (!) 97.5 F (36.4 C) (Oral)   Ht 5\' 5"  (1.651 m)   Wt 227 lb (103 kg)   LMP 06/09/2017   BMI 37.77 kg/m   Wt Readings from Last 3 Encounters:  06/23/17 227 lb  (103 kg)  06/23/17 224 lb 12.8 oz (102 kg)  06/09/17 228 lb 6.4 oz (103.6 kg)    Physical Exam  Constitutional: She is oriented to person, place, and time. She appears well-developed and well-nourished. No distress.  Eyes: Conjunctivae are normal.  Neck: Neck supple. No thyromegaly present.  Cardiovascular: Normal rate, regular rhythm, normal heart sounds and intact distal pulses.  No murmur heard. Pulmonary/Chest: Effort normal and breath sounds normal. No respiratory distress. She has no wheezes. She has no rales.  Musculoskeletal: Normal range of motion. She exhibits no edema.  Lymphadenopathy:    She has no cervical adenopathy.  Neurological: She is alert and oriented to person, place, and time. Coordination normal.  Skin: Skin is warm and dry. No rash noted. She is not diaphoretic.  Psychiatric: She has a normal mood and affect. Her speech is normal and behavior is normal. Thought content normal.  Nursing note and vitals reviewed.       Assessment & Plan:   Problem List Items Addressed This Visit      Other   Post traumatic stress disorder - Primary (Chronic)   Depression, recurrent ([place])      Patient has come off all medications a month ago and she feels like she is doing very well and does not want to go back on any of them  and her husband agrees in the postpartum stage that she was dealing with the stress that she is doing with she seems to be doing a lot better with.  She denies any suicidal ideations or thoughts of hurting herself. Follow up plan: Return if symptoms worsen or fail to improve.  Counseling provided for all of the vaccine components No orders of the defined types were placed in this encounter.   Caryl Pina, MD [facility] 06/23/2017, 4:20 PM

## 2017-06-23 NOTE — Progress Notes (Signed)
[facility] Visit  06/23/2017            Patient name: Sonya Padilla MRN [id number]  Date of birth: [birth year]  CC & HPI:  Sonya Padilla is a 21 y.o. female presenting today for discussion about bilateral tubal ligation for a permanent birth control option. She has two kids, and states she does not want any more. She has been trying to get her tubes tied since she was 18, after her first child, and now after her second child. She is accompanied by her partner today.   ROS:  ROS -fever -chills All systems are negative except as noted in the HPI and PMH.   Pertinent History Reviewed:   Reviewed: Significant for irregular period Medical         Past Medical History:  Diagnosis Date   Amenorrhea 10/02/2014   Broken leg    left   Depression    Irregular periods 05/13/2015   Nausea 03/13/2015   Obesity    Pelvic pain in female 05/13/2015   Pregnant 05/13/2015   RLQ abdominal pain 10/02/2014   Suicide ([place])    Urinary frequency 03/13/2015                              Surgical Hx:    Past Surgical History:  Procedure Laterality Date   ESOPHAGOGASTRODUODENOSCOPY N/A 12/12/2014   [id number] reflux    WISDOM TOOTH EXTRACTION Bilateral    Medications: Reviewed & Updated - see associated section                       Current Outpatient Medications:    norethindrone (MICRONOR,CAMILA,ERRIN) 0.35 MG tablet, TAKE ONE TABLET BY MOUTH ONCE DAILY, Disp: 28 tablet, Rfl: 11   Eluxadoline (VIBERZI) 75 MG TABS, Take 1 tablet by mouth daily with breakfast. (Patient not taking: Reported on 06/23/2017), Disp: 90 tablet, Rfl: 3   omeprazole (PRILOSEC) 20 MG capsule, Take 2 capsules (40 mg total) by mouth daily. (Patient not taking: Reported on 06/23/2017), Disp: 60 capsule, Rfl: 3   sertraline (ZOLOFT) 100 MG tablet, Take 1 tablet (100 mg total) by mouth daily. (Patient not taking: Reported on 06/23/2017), Disp: 90 tablet, Rfl: 3   sucralfate (CARAFATE) 1 g  tablet, Take 1 tablet (1 g total) by mouth 4 (four) times daily -  with meals and at bedtime. (Patient not taking: Reported on 06/23/2017), Disp: 90 tablet, Rfl: 5   topiramate (TOPAMAX) 50 MG tablet, Take 1 tablet (50 mg total) by mouth 2 (two) times daily. (Patient not taking: Reported on 06/23/2017), Disp: 60 tablet, Rfl: 12  Current Facility-Administered Medications:    gi cocktail (Maalox,Lidocaine,Donnatal), 30 mL, Oral, Once, Dettinger, Fransisca Kaufmann, MD   Social History: Reviewed -  reports that she quit smoking about 4 years ago. Her smoking use included cigarettes. She has a 1.00 pack-year smoking history. She has quit using smokeless tobacco. Her smokeless tobacco use included chew.  Objective Findings:  Vitals: Blood pressure 108/60, pulse (!) 103, height 5\' 5"  (1.651 m), weight 224 lb 12.8 oz (102 kg), last menstrual period 06/09/2017, not currently breastfeeding.  Physical Examination: General appearance - alert, well appearing, and in no distress Mental status - alert, oriented to person, place, and time Pelvic - examination not indicated  Discussion: 1. Discussed with pt risks and benefits of bilateral tubal ligation. She is concerned she may be pregnant. Her  birth control causes her period to be late, and her home pregnancy tests show negative when she is pregnant. Pt is very sure that she wants BTL.  At end of discussion, pt had opportunity to ask questions and has no further questions at this time.   Specific discussion of BTL as noted above. Greater than 50% was spent in counseling and coordination of care with the patient.   Total time greater than: 25 minutes.     Assessment & Plan:   A:  1. Discussed BTL with pt, tubal papers signed today  P:  1. Order HCG test to determine pregnancy status 2. F/u in 30 days for pre-op for BTL   By signing my name below, I, [name], attest that this documentation has been prepared under the direction and in the presence of  Jonnie Kind, MD. Electronically Signed: Jabier Gauss, Medical Scribe. 06/23/17. 11:51 AM.  I personally performed the services described in this documentation, which was SCRIBED in my presence. The recorded information has been reviewed and considered accurate. It has been edited as necessary during review. Jonnie Kind, MD

## 2017-06-24 LAB — HCG, SERUM, QUALITATIVE: HCG, BETA SUBUNIT, QUAL, SERUM: NEGATIVE m[IU]/mL (ref <6)

## 2017-08-11 ENCOUNTER — Ambulatory Visit: Payer: Medicaid Other | Admitting: Obstetrics and Gynecology

## 2017-08-11 ENCOUNTER — Encounter: Payer: Self-pay | Admitting: Obstetrics and Gynecology

## 2017-08-11 VITALS — BP 110/70 | HR 96 | Ht 65.0 in | Wt 223.0 lb

## 2017-08-11 DIAGNOSIS — Z3009 Encounter for other general counseling and advice on contraception: Secondary | ICD-10-CM

## 2017-08-11 NOTE — Progress Notes (Signed)
Patient ID: Sonya Padilla, female   DOB: 08/06/1995, 22 y.o.   MRN: [id number] Preoperative History and Physical  Sonya Padilla is a 22 y.o. [id number] here for surgical management of sterilization. She has had two vaginal deliveries and she has decided she does not want anymore children. She has been confident for years that she wants permanent sterilizaiton, and has signed tubal forms, and reaffirmed desire for BTL again today No significant preoperative concerns.questions answered.   Proposed surgery: Laparoscopic Tubal Ligation Falope rings  Past Medical History:  Diagnosis Date  . Amenorrhea 10/02/2014  . Broken leg    left  . Depression   . Irregular periods 05/13/2015  . Nausea 03/13/2015  . Obesity   . Pelvic pain in female 05/13/2015  . Pregnant 05/13/2015  . RLQ abdominal pain 10/02/2014  . Suicide ([place])   . Urinary frequency 03/13/2015   Past Surgical History:  Procedure Laterality Date  . ESOPHAGOGASTRODUODENOSCOPY N/A 12/12/2014   [id number] reflux   . WISDOM TOOTH EXTRACTION Bilateral    OB History  Gravida Para Term Preterm AB Living  2 2 2     2   SAB TAB Ectopic Multiple Live Births        0 2    # Outcome Date GA Lbr Len/2nd Weight Sex Delivery Anes PTL Lv  2 Term 01/23/16 [redacted]w[redacted]d 11:48 / 00:46 9 lb 8 oz (4.31 kg) M Vag-Spont EPI  LIV  1 Term 04/17/14 [redacted]w[redacted]d  6 lb 15 oz (3.147 kg) F Vag-Spont EPI N LIV    Patient denies any other pertinent gynecologic issues.   Current Outpatient Medications on File Prior to Visit  Medication Sig Dispense Refill  . norethindrone (MICRONOR,CAMILA,ERRIN) 0.35 MG tablet TAKE ONE TABLET BY MOUTH ONCE DAILY 28 tablet 11  . topiramate (TOPAMAX) 50 MG tablet Take 1 tablet (50 mg total) by mouth 2 (two) times daily. 60 tablet 12  . Eluxadoline (VIBERZI) 75 MG TABS Take 1 tablet by mouth daily with breakfast. (Patient not taking: Reported on 06/23/2017) 90 tablet 3   Current Facility-Administered Medications on File Prior to  Visit  Medication Dose Route Frequency Provider Last Rate Last Dose  . gi cocktail (Maalox,Lidocaine,Donnatal)  30 mL Oral Once Dettinger, Fransisca Kaufmann, MD       Allergies  Allergen Reactions  . Tylenol [Acetaminophen] Other (See Comments)    Migraine    Social History:   reports that she quit smoking about 4 years ago. Her smoking use included cigarettes. She has a 1.00 pack-year smoking history. She has quit using smokeless tobacco. Her smokeless tobacco use included chew. She reports that she does not drink alcohol or use drugs.  Family History  Problem Relation Age of Onset  . Hypertension Mother   . Stroke Paternal Aunt   . Diabetes Maternal Grandmother   . Heart disease Maternal Grandmother   . Hyperlipidemia Maternal Grandmother   . Drug abuse Maternal Grandmother   . Glaucoma Maternal Grandmother   . Cataracts Maternal Grandmother   . Cancer Paternal Grandfather   . Colon cancer Paternal Grandfather   . Psoriasis Sister   . Prostate cancer Maternal Grandfather   . Alcohol abuse Maternal Grandfather   . Other Father        heart issues  . Heart attack Father   . Cervical cancer Maternal Aunt   . Physical abuse Maternal Uncle   . Obesity Other   . Bipolar disorder Other        paternal  aunt and cousin  . Drug abuse Other     Review of Systems: Noncontributory  PHYSICAL EXAM: Blood pressure 110/70, pulse 96, height 5\' 5"  (1.651 m), weight 223 lb (101.2 kg), last menstrual period 07/15/2017, not currently breastfeeding. General appearance - alert, well appearing, and in no distress Chest - clear to auscultation, no wheezes, rales or rhonchi, symmetric air entry Heart - normal rate and regular rhythm Abdomen - soft, nontender, nondistended, no masses or organomegaly                     Pelvic - examination   External genitalia- normal  Cervix- multiparous   Uterus- anterior  Adnexa- negative Extremities - peripheral pulses normal, no pedal edema, no clubbing or  cyanosis  Labs: No results found for this or any previous visit (from the past 336 hour(s)).  Imaging Studies: No results found.  Assessment: Patient Active Problem List   Diagnosis Date Noted  . Encounter for counseling regarding contraception 06/23/2017  . IBS (irritable bowel syndrome) 12/23/2016  . Depression, recurrent ([place]) 08/26/2016  . Nexplanon in place 03/19/2016  . Nexplanon insertion 03/04/2016  . History of postpartum hemorrhage 02/26/2016  . Ulcerative esophagitis 01/30/2015  . Gastroesophageal reflux disease with esophagitis   . Amenorrhea 10/02/2014  . RLQ abdominal pain 10/02/2014  . Migraines w/ aura 07/18/2014  . Obesity (BMI 30-39.9) 10/24/2013  . Smoker 10/24/2013  . Marijuana use 10/24/2013  . 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 Post traumatic stress disorder 08/06/2011    Plan: 1. Patient will undergo surgical management with Laparoscopic Tubal Ligation Falope rings.  2. Will call with surgery dates 3. F/u 4 weeks for Post-op   .mec 08/11/2017 11:09 AM  By signing my name below, I, Margit Banda, attest that this documentation has been prepared under the direction and in the presence of Jonnie Kind, MD. Electronically Signed: Margit Banda, Medical Scribe. 08/11/17. 11:10 AM.  I personally performed the services described in this documentation, which was SCRIBED in my presence. The recorded information has been reviewed and considered accurate. It has been edited as necessary during review. Jonnie Kind, MD

## 2017-08-13 ENCOUNTER — Telehealth: Payer: Self-pay | Admitting: Obstetrics and Gynecology

## 2017-08-13 NOTE — Telephone Encounter (Signed)
Spoke to Dr Glo Herring who stated rings had a higher success rate and was the reason he proposed that. Advised patient to call our office Monday if she definitely  Wanted to have clip, burned, tied.

## 2017-08-13 NOTE — Telephone Encounter (Signed)
Patient called stating that she has a question regarding her Tubal, pt states that her surgery is coming up soon. Please contact pt

## 2017-08-13 NOTE — Telephone Encounter (Signed)
Patient states it was discussed at her visit in December that she wanted her tubes "cut, burned and tied.'"

## 2017-08-16 NOTE — Patient Instructions (Signed)
Sonya Padilla  08/16/2017     @PREFPERIOPPHARMACY @   Your procedure is scheduled on  08/24/2017 .  Report to [facility] at   1100  A.M.  Call this number if you have problems the morning of surgery:  [phone]   Remember:  Do not eat food or drink liquids after midnight.  Take these medicines the morning of surgery with A SIP OF WATER  topamax.   Do not wear jewelry, make-up or nail polish.  Do not wear lotions, powders, or perfumes, or deodorant.  Do not shave 48 hours prior to surgery.  Men may shave face and neck.  Do not bring valuables to the hospital.  [facility] is not responsible for any belongings or valuables.  Contacts, dentures or bridgework may not be worn into surgery.  Leave your suitcase in the car.  After surgery it may be brought to your room.  For patients admitted to the hospital, discharge time will be determined by your treatment team.  Patients discharged the day of surgery will not be allowed to drive home.   Name and phone number of your driver:   family Special instructions:  None  Please read over the following fact sheets that you were given. Anesthesia Post-op Instructions and Care and Recovery After Surgery       Laparoscopic Tubal Ligation Laparoscopic tubal ligation is a procedure to close the fallopian tubes. This is done so that you cannot get pregnant. When the fallopian tubes are closed, the eggs that your ovaries release cannot enter the uterus, and sperm cannot reach the released eggs. A laparoscopic tubal ligation is sometimes called "getting your tubes tied." You should not have this procedure if you want to get pregnant someday or if you are unsure about having more children. Tell a health care provider about:  Any allergies you have.  All medicines you are taking, including vitamins, herbs, eye drops, creams, and over-the-counter medicines.  Any problems you or family members have had with anesthetic  medicines.  Any blood disorders you have.  Any surgeries you have had.  Any medical conditions you have.  Whether you are pregnant or may be pregnant.  Any past pregnancies. What are the risks? Generally, this is a safe procedure. However, problems may occur, including:  Infection.  Bleeding.  Injury to surrounding organs.  Side effects from anesthetics.  Failure of the procedure.  This procedure can increase your risk of a kind of pregnancy in which a fertilized egg attaches to the outside of the uterus (ectopic pregnancy). What happens before the procedure?  Ask your health care provider about: ? Changing or stopping your regular medicines. This is especially important if you are taking diabetes medicines or blood thinners. ? Taking medicines such as aspirin and ibuprofen. These medicines can thin your blood. Do not take these medicines before your procedure if your health care provider instructs you not to.  Follow instructions from your health care provider about eating and drinking restrictions.  Plan to have someone take you home after the procedure.  If you go home right after the procedure, plan to have someone with you for 24 hours. What happens during the procedure?  You will be given one or more of the following: ? A medicine to help you relax (sedative). ? A medicine to numb the area (local anesthetic). ? A medicine to make you fall asleep (general anesthetic). ? A medicine that  is injected into an area of your body to numb everything below the injection site (regional anesthetic).  An IV tube will be inserted into one of your veins. It will be used to give you medicines and fluids during the procedure.  Your bladder may be emptied with a small tube (catheter).  If you have been given a general anesthetic, a tube will be put down your throat to help you breathe.  Two small cuts (incisions) will be made in your lower abdomen and near your belly  button.  Your abdomen will be inflated with a gas. This will let the surgeon see better and will give the surgeon room to work.  A thin, lighted tube (laparoscope) with a camera attached will be inserted into your abdomen through one of the incisions. Small instruments will be inserted through the other incision.  The fallopian tubes will be tied off, burned (cauterized), or blocked with a clip, ring, or clamp. A small portion in the center of each fallopian tube may be removed.  The gas will be released from the abdomen.  The incisions will be closed with stitches (sutures).  A bandage (dressing) will be placed over the incisions. The procedure may vary among health care providers and hospitals. What happens after the procedure?  Your blood pressure, heart rate, breathing rate, and blood oxygen level will be monitored often until the medicines you were given have worn off.  You will be given medicine to help with pain, nausea, and vomiting as needed. This information is not intended to replace advice given to you by your health care provider. Make sure you discuss any questions you have with your health care provider. Document Released: 10/05/2000 Document Revised: 12/05/2015 Document Reviewed: 06/09/2015 Elsevier Interactive Patient Education  2018 [employer].  Laparoscopic Tubal Ligation, Care After Refer to this sheet in the next few weeks. These instructions provide you with information about caring for yourself after your procedure. Your health care provider may also give you more specific instructions. Your treatment has been planned according to current medical practices, but problems sometimes occur. Call your health care provider if you have any problems or questions after your procedure. What can I expect after the procedure? After the procedure, it is common to have:  A sore throat.  Discomfort in your shoulder.  Mild discomfort or cramping in your abdomen.  Gas  pains.  Pain or soreness in the area where the surgical cut (incision) was made.  A bloated feeling.  Tiredness.  Nausea.  Vomiting.  Follow these instructions at home: Medicines  Take over-the-counter and prescription medicines only as told by your health care provider.  Do not take aspirin because it can cause bleeding.  Do not drive or operate heavy machinery while taking prescription pain medicine. Activity  Rest for the rest of the day.  Return to your normal activities as told by your health care provider. Ask your health care provider what activities are safe for you. Incision care   Follow instructions from your health care provider about how to take care of your incision. Make sure you: ? Wash your hands with soap and water before you change your bandage (dressing). If soap and water are not available, use hand sanitizer. ? Change your dressing as told by your health care provider. ? Leave stitches (sutures) in place. They may need to stay in place for 2 weeks or longer.  Check your incision area every day for signs of infection. Check for: ?  More redness, swelling, or pain. ? More fluid or blood. ? Warmth. ? Pus or a bad smell. Other Instructions  Do not take baths, swim, or use a hot tub until your health care provider approves. You may take showers.  Keep all follow-up visits as told by your health care provider. This is important.  Have someone help you with your daily household tasks for the first few days. Contact a health care provider if:  You have more redness, swelling, or pain around your incision.  Your incision feels warm to the touch.  You have pus or a bad smell coming from your incision.  The edges of your incision break open after the sutures have been removed.  Your pain does not improve after 2-3 days.  You have a rash.  You repeatedly become dizzy or light-headed.  Your pain medicine is not helping.  You are constipated. Get  help right away if:  You have a fever.  You faint.  You have increasing pain in your abdomen.  You have severe pain in one or both of your shoulders.  You have fluid or blood coming from your sutures or from your vagina.  You have shortness of breath or difficulty breathing.  You have chest pain or leg pain.  You have ongoing nausea, vomiting, or diarrhea. This information is not intended to replace advice given to you by your health care provider. Make sure you discuss any questions you have with your health care provider. Document Released: 01/16/2005 Document Revised: 12/02/2015 Document Reviewed: 06/09/2015 Elsevier Interactive Patient Education  2018 [place] Anesthesia, Adult General anesthesia is the use of medicines to make a person "go to sleep" (be unconscious) for a medical procedure. General anesthesia is often recommended when a procedure:  Is long.  Requires you to be still or in an unusual position.  Is major and can cause you to lose blood.  Is impossible to do without general anesthesia.  The medicines used for general anesthesia are called general anesthetics. In addition to making you sleep, the medicines:  Prevent pain.  Control your blood pressure.  Relax your muscles.  Tell a health care provider about:  Any allergies you have.  All medicines you are taking, including vitamins, herbs, eye drops, creams, and over-the-counter medicines.  Any problems you or family members have had with anesthetic medicines.  Types of anesthetics you have had in the past.  Any bleeding disorders you have.  Any surgeries you have had.  Any medical conditions you have.  Any history of heart or lung conditions, such as heart failure, sleep apnea, or chronic obstructive pulmonary disease (COPD).  Whether you are pregnant or may be pregnant.  Whether you use tobacco, alcohol, marijuana, or street drugs.  Any history of Armed forces logistics/support/administrative officer.  Any  history of depression or anxiety. What are the risks? Generally, this is a safe procedure. However, problems may occur, including:  Allergic reaction to anesthetics.  Lung and heart problems.  Inhaling food or liquids from your stomach into your lungs (aspiration).  Injury to nerves.  Waking up during your procedure and being unable to move (rare).  Extreme agitation or a state of mental confusion (delirium) when you wake up from the anesthetic.  Air in the bloodstream, which can lead to stroke.  These problems are more likely to develop if you are having a major surgery or if you have an advanced medical condition. You can prevent some of these complications by answering  all of your health care provider's questions thoroughly and by following all pre-procedure instructions. General anesthesia can cause side effects, including:  Nausea or vomiting  A sore throat from the breathing tube.  Feeling cold or shivery.  Feeling tired, washed out, or achy.  Sleepiness or drowsiness.  Confusion or agitation.  What happens before the procedure? Staying hydrated Follow instructions from your health care provider about hydration, which may include:  Up to 2 hours before the procedure - you may continue to drink clear liquids, such as water, clear fruit juice, black coffee, and plain tea.  Eating and drinking restrictions Follow instructions from your health care provider about eating and drinking, which may include:  8 hours before the procedure - stop eating heavy meals or foods such as meat, fried foods, or fatty foods.  6 hours before the procedure - stop eating light meals or foods, such as toast or cereal.  6 hours before the procedure - stop drinking milk or drinks that contain milk.  2 hours before the procedure - stop drinking clear liquids.  Medicines  Ask your health care provider about: ? Changing or stopping your regular medicines. This is especially important if  you are taking diabetes medicines or blood thinners. ? Taking medicines such as aspirin and ibuprofen. These medicines can thin your blood. Do not take these medicines before your procedure if your health care provider instructs you not to. ? Taking new dietary supplements or medicines. Do not take these during the week before your procedure unless your health care provider approves them.  If you are told to take a medicine or to continue taking a medicine on the day of the procedure, take the medicine with sips of water. General instructions   Ask if you will be going home the same day, the following day, or after a longer hospital stay. ? Plan to have someone take you home. ? Plan to have someone stay with you for the first 24 hours after you leave the hospital or clinic.  For 3-6 weeks before the procedure, try not to use any tobacco products, such as cigarettes, chewing tobacco, and e-cigarettes.  You may brush your teeth on the morning of the procedure, but make sure to spit out the toothpaste. What happens during the procedure?  You will be given anesthetics through a mask and through an IV tube in one of your veins.  You may receive medicine to help you relax (sedative).  As soon as you are asleep, a breathing tube may be used to help you breathe.  An anesthesia specialist will stay with you throughout the procedure. He or she will help keep you comfortable and safe by continuing to give you medicines and adjusting the amount of medicine that you get. He or she will also watch your blood pressure, pulse, and oxygen levels to make sure that the anesthetics do not cause any problems.  If a breathing tube was used to help you breathe, it will be removed before you wake up. The procedure may vary among health care providers and hospitals. What happens after the procedure?  You will wake up, often slowly, after the procedure is complete, usually in a recovery area.  Your blood  pressure, heart rate, breathing rate, and blood oxygen level will be monitored until the medicines you were given have worn off.  You may be given medicine to help you calm down if you feel anxious or agitated.  If you will be going home  the same day, your health care provider may check to make sure you can stand, drink, and urinate.  Your health care providers will treat your pain and side effects before you go home.  Do not drive for 24 hours if you received a sedative.  You may: ? Feel nauseous and vomit. ? Have a sore throat. ? Have mental slowness. ? Feel cold or shivery. ? Feel sleepy. ? Feel tired. ? Feel sore or achy, even in parts of your body where you did not have surgery. This information is not intended to replace advice given to you by your health care provider. Make sure you discuss any questions you have with your health care provider. Document Released: 10/06/2007 Document Revised: 12/10/2015 Document Reviewed: 06/13/2015 Elsevier Interactive Patient Education  2018 [place] Anesthesia, Adult, Care After These instructions provide you with information about caring for yourself after your procedure. Your health care provider may also give you more specific instructions. Your treatment has been planned according to current medical practices, but problems sometimes occur. Call your health care provider if you have any problems or questions after your procedure. What can I expect after the procedure? After the procedure, it is common to have:  Vomiting.  A sore throat.  Mental slowness.  It is common to feel:  Nauseous.  Cold or shivery.  Sleepy.  Tired.  Sore or achy, even in parts of your body where you did not have surgery.  Follow these instructions at home: For at least 24 hours after the procedure:  Do not: ? Participate in activities where you could fall or become injured. ? Drive. ? Use heavy machinery. ? Drink alcohol. ? Take  sleeping pills or medicines that cause drowsiness. ? Make important decisions or sign legal documents. ? Take care of children on your own.  Rest. Eating and drinking  If you vomit, drink water, juice, or soup when you can drink without vomiting.  Drink enough fluid to keep your urine clear or pale yellow.  Make sure you have little or no nausea before eating solid foods.  Follow the diet recommended by your health care provider. General instructions  Have a responsible adult stay with you until you are awake and alert.  Return to your normal activities as told by your health care provider. Ask your health care provider what activities are safe for you.  Take over-the-counter and prescription medicines only as told by your health care provider.  If you smoke, do not smoke without supervision.  Keep all follow-up visits as told by your health care provider. This is important. Contact a health care provider if:  You continue to have nausea or vomiting at home, and medicines are not helpful.  You cannot drink fluids or start eating again.  You cannot urinate after 8-12 hours.  You develop a skin rash.  You have fever.  You have increasing redness at the site of your procedure. Get help right away if:  You have difficulty breathing.  You have chest pain.  You have unexpected bleeding.  You feel that you are having a life-threatening or urgent problem. This information is not intended to replace advice given to you by your health care provider. Make sure you discuss any questions you have with your health care provider. Document Released: 10/05/2000 Document Revised: 12/02/2015 Document Reviewed: 06/13/2015 Elsevier Interactive Patient Education  [employer].

## 2017-08-17 ENCOUNTER — Other Ambulatory Visit (HOSPITAL_COMMUNITY): Payer: Medicaid Other

## 2017-08-18 ENCOUNTER — Encounter: Payer: Self-pay | Admitting: Nurse Practitioner

## 2017-08-18 ENCOUNTER — Other Ambulatory Visit: Payer: Self-pay | Admitting: Obstetrics and Gynecology

## 2017-08-18 ENCOUNTER — Ambulatory Visit: Payer: Medicaid Other | Admitting: Nurse Practitioner

## 2017-08-18 ENCOUNTER — Other Ambulatory Visit: Payer: Self-pay

## 2017-08-18 ENCOUNTER — Encounter (HOSPITAL_COMMUNITY): Payer: Self-pay

## 2017-08-18 ENCOUNTER — Encounter (HOSPITAL_COMMUNITY)
Admission: RE | Admit: 2017-08-18 | Discharge: 2017-08-18 | Disposition: A | Discharge status: Home / Self Care | Payer: Medicaid Other | Source: Ambulatory Visit | Attending: Obstetrics and Gynecology | Admitting: Obstetrics and Gynecology

## 2017-08-18 VITALS — BP 124/71 | HR 88 | Temp 98.2°F | Ht 65.0 in | Wt 224.0 lb

## 2017-08-18 DIAGNOSIS — K2100 Gastro-esophageal reflux disease with esophagitis, without bleeding: Secondary | ICD-10-CM

## 2017-08-18 DIAGNOSIS — K580 Irritable bowel syndrome with diarrhea: Secondary | ICD-10-CM

## 2017-08-18 DIAGNOSIS — Z3009 Encounter for other general counseling and advice on contraception: Secondary | ICD-10-CM | POA: Diagnosis not present

## 2017-08-18 DIAGNOSIS — R1033 Periumbilical pain: Secondary | ICD-10-CM | POA: Diagnosis not present

## 2017-08-18 DIAGNOSIS — Z302 Encounter for sterilization: Secondary | ICD-10-CM | POA: Diagnosis present

## 2017-08-18 DIAGNOSIS — R109 Unspecified abdominal pain: Secondary | ICD-10-CM | POA: Insufficient documentation

## 2017-08-18 HISTORY — DX: Headache, unspecified: R51.9

## 2017-08-18 HISTORY — DX: Headache: R51

## 2017-08-18 HISTORY — DX: Anxiety disorder, unspecified: F41.9

## 2017-08-18 LAB — URINALYSIS, ROUTINE W REFLEX MICROSCOPIC
BILIRUBIN URINE: NEGATIVE
Glucose, UA: NEGATIVE mg/dL
HGB URINE DIPSTICK: NEGATIVE
Ketones, ur: NEGATIVE mg/dL
Leukocytes, UA: NEGATIVE
Nitrite: NEGATIVE
PH: 7 (ref 5.0–8.0)
Protein, ur: NEGATIVE mg/dL
SPECIFIC GRAVITY, URINE: 1.002 — AB (ref 1.005–1.030)

## 2017-08-18 LAB — CBC
HEMATOCRIT: 37.7 % (ref 36.0–46.0)
Hemoglobin: 12.6 g/dL (ref 12.0–15.0)
MCH: 29.4 pg (ref 26.0–34.0)
MCHC: 33.4 g/dL (ref 30.0–36.0)
MCV: 87.9 fL (ref 78.0–100.0)
Platelets: 206 10*3/uL (ref 150–400)
RBC: 4.29 MIL/uL (ref 3.87–5.11)
RDW: 13.2 % (ref 11.5–15.5)
WBC: 8.7 10*3/uL (ref 4.0–10.5)

## 2017-08-18 LAB — HCG, SERUM, QUALITATIVE: Preg, Serum: NEGATIVE

## 2017-08-18 NOTE — Assessment & Plan Note (Signed)
History of GERD, ulcerative esophagitis, "chest pain" that she tells her primary care is abdominal pain radiating up into her chest.  She was previously well managed on Dexilant but this had to be discontinued when she became pregnant.  She is no longer pregnant and I have offered to restart her Dexilant.  She is confused as to why I would do this stating that she is not having any heartburn.  However, on exam her left upper quadrant and epigastric area is tender to palpation.  I have advised her to let us know if she starts having more symptoms and we can revisit the idea.  Follow-up in 2 months.

## 2017-08-18 NOTE — Progress Notes (Signed)
Referring Provider: Timmothy Euler, MD Primary Care Physician:  Timmothy Euler, MD Primary GI:  Dr. Gala Romney  Chief Complaint  Patient presents with  . Diarrhea    improved  . Abdominal Pain    epigastric    HPI:   Sonya Padilla is a 22 y.o. female who presents for followup on diarrhea and abdominal pain.  Patient was last seen in our office 12/23/2016 for IBS and epigastric/chest pain.  When we first began seeing her she was having up to 8 loose/watery stools a day with nausea and vomiting.  Last visit she was doing well overall with diarrhea resolved.  Still with lower abdominal pain every time she has a bowel movement and stools are mix of normal to loose.  Estimates 1-2 out of 10 bowel movements are loose.  Abdominal pain improves after bowel movement.  Bentyl helped for couple weeks but then stopped.  Symptomatically GERD with specific foods otherwise well controlled on PPI.  She thinks she may have a hernia.  No other GI symptoms.  She was noting chest pain at that time 5 out of 10 which frequently occurs.  She notified her PCP a few months ago and they put her on Prilosec.  No longer taking NSAIDs.  Has not gone to the ER for evaluation.  Commended continue Prilosec, start Viberzi 75 mg, call in 2 weeks and let us know if it is helping.  Notify primary care persistent pain despite 2-3 months of acid blocker.  Follow-up in 3 months.  It does not appear progress report related to Viberzi was called.  When she saw her primary care she stated her chest pain was actually stomach pain that radiated up into her esophagus/chest.  This is different than what I was told previously.  Her primary care started her on Carafate 1 g tablets.  She told her primary care that she did not think Viberzi was helping, although she did not call to notify us of this.  Today she states she's doing better. Her diarrhea is resolved. This happened when she reduced her coffee intake. Viberzi didn't help.  Has periumbilical abdominal pain, feels a knott int he area. Pain occurs with heavy lifting (ie- carrying both children at once). No worse with eating. Denies N/V, hematochezia, melena, fever, chills, unintentional weight loss. Still with intermittent chest pain (has not followed up with PCP), has worsening dyspnea. Denies lightheadedness, syncope, near syncope. Denies any other upper or lower GI symptoms.  Past Medical History:  Diagnosis Date  . Amenorrhea 10/02/2014  . Broken leg    left  . Depression   . Irregular periods 05/13/2015  . Nausea 03/13/2015  . Obesity   . Pelvic pain in female 05/13/2015  . Pregnant 05/13/2015  . RLQ abdominal pain 10/02/2014  . Suicide ([place])   . Urinary frequency 03/13/2015    Past Surgical History:  Procedure Laterality Date  . ESOPHAGOGASTRODUODENOSCOPY N/A 12/12/2014   WCB:[id number] reflux   . WISDOM TOOTH EXTRACTION Bilateral     Current Outpatient Medications  Medication Sig Dispense Refill  . ferrous sulfate 325 (65 FE) MG tablet Take 325 mg by mouth daily as needed.    . norethindrone (MICRONOR,CAMILA,ERRIN) 0.35 MG tablet TAKE ONE TABLET BY MOUTH ONCE DAILY 28 tablet 11  . topiramate (TOPAMAX) 50 MG tablet Take 1 tablet (50 mg total) by mouth 2 (two) times daily. 60 tablet 12   Current Facility-Administered Medications  Medication Dose Route Frequency Provider Last Rate  Last Dose  . gi cocktail (Maalox,Lidocaine,Donnatal)  30 mL Oral Once Dettinger, Fransisca Kaufmann, MD        Allergies as of 08/18/2017 - Review Complete 08/18/2017  Allergen Reaction Noted  . Tylenol [acetaminophen] Other (See Comments) 09/14/2013    Family History  Problem Relation Age of Onset  . Hypertension Mother   . Stroke Paternal Aunt   . Diabetes Maternal Grandmother   . Heart disease Maternal Grandmother   . Hyperlipidemia Maternal Grandmother   . Drug abuse Maternal Grandmother   . Glaucoma Maternal Grandmother   . Cataracts Maternal Grandmother   . Cancer  Paternal Grandfather   . Colon cancer Paternal Grandfather   . Psoriasis Sister   . Prostate cancer Maternal Grandfather   . Alcohol abuse Maternal Grandfather   . Other Father        heart issues  . Heart attack Father   . Cervical cancer Maternal Aunt   . Physical abuse Maternal Uncle   . Obesity Other   . Bipolar disorder Other        paternal aunt and cousin  . Drug abuse Other     Social History   Socioeconomic History  . Marital status: Single    Spouse name: None  . Number of children: 2  . Years of education: GED  . Highest education level: None  Social Needs  . Financial resource strain: None  . Food insecurity - worry: None  . Food insecurity - inability: None  . Transportation needs - medical: None  . Transportation needs - non-medical: None  Occupational History    Comment: NA  Tobacco Use  . Smoking status: Former Smoker    Packs/day: 0.50    Years: 2.00    Pack years: 1.00    Types: Cigarettes    Last attempt to quit: 11/27/2012    Years since quitting: 4.7  . Smokeless tobacco: Former Systems developer    Types: Chew  . Tobacco comment: quit in 04/2015  Substance and Sexual Activity  . Alcohol use: No    Alcohol/week: 0.0 oz  . Drug use: No  . Sexual activity: Yes    Partners: Male    Birth control/protection: Pill  Other Topics Concern  . None  Social History Narrative   Lives with sig other, son, daughter   Caffeine- coffee, 4- 16 oz daily    Review of Systems: General: Negative for anorexia, weight loss, fever, chills, fatigue, weakness. ENT: Negative for hoarseness, difficulty swallowing , nasal congestion. CV: Negative for chest pain, angina, palpitations, dyspnea on exertion, peripheral edema.  Respiratory: Negative for dyspnea at rest, dyspnea on exertion, cough, sputum, wheezing.  GI: See history of present illness. Endo: Negative for unusual weight change.  Heme: Negative for bruising or bleeding.  Physical Exam: BP 124/71   Pulse 88    Temp 98.2 F (36.8 C) (Oral)   Ht 5\' 5"  (1.651 m)   Wt 224 lb (101.6 kg)   BMI 37.28 kg/m  General:   Alert and oriented. Pleasant and cooperative. Well-nourished and well-developed.  Eyes:  Without icterus, sclera clear and conjunctiva pink.  Ears:  Normal auditory acuity. Cardiovascular:  S1, S2 present without murmurs appreciated. Extremities without clubbing or edema. Respiratory:  Clear to auscultation bilaterally. No wheezes, rales, or rhonchi. No distress.  Gastrointestinal:  +BS, soft, and non-distended. TTP LUQ/epigastric area as well as periumbilical. No HSM noted. No guarding or rebound. No masses appreciated.  Rectal:  Deferred  Musculoskalatal:  Symmetrical  without gross deformities. Normal posture. Neurologic:  Alert and oriented x4;  grossly normal neurologically. Psych:  Alert and cooperative. Normal mood and affect. Heme/Lymph/Immune: No excessive bruising noted.    08/18/2017 10:47 AM   Disclaimer: This note was dictated with voice recognition software. Similar sounding words can inadvertently be transcribed and may not be corrected upon review.

## 2017-08-18 NOTE — Assessment & Plan Note (Signed)
She is previously tried on [employer].  She did not call a progress report.  Apparently, it did not work.  However, her diarrhea has resolved at this time.  She states this happened when she cut back and eliminated caffeine/coffee.  Recommend she continue to do so.  Notify us of any worsening diarrhea.  Follow-up in 2 months.

## 2017-08-18 NOTE — Assessment & Plan Note (Signed)
The patient now complains of periumbilical abdominal pain.  States she feels like there is a knot down there.  The pain is worse if she does heavy lifting such as holding both of her children at once.  It is tender to palpation, some mild not sensation just inferior to the umbilicus.  It is possible with her childbirth that she could have a hernia developing.  She has not had much abdominal imaging.  I will check a CT of the abdomen and pelvis due to persistent/worsening periumbilical pain, worse on exam with concerns for possible hernia development.  She does have a hernia and it is becoming more painful she might need to be referred to a surgeon for further evaluation.  Will check CBC, CMP, lipase as well.  Return for follow-up in 2 months.

## 2017-08-18 NOTE — Progress Notes (Signed)
CC'D TO PCP °

## 2017-08-18 NOTE — Patient Instructions (Signed)
1. Continue to avoid caffeine to prevent diarrhea. 2. We will order and schedule your CAT scan of your abdomen for you. 3. Have your labs drawn when you are able to. 4. Return for follow-up in 2 months. 5. Call us if you have any questions or concerns.

## 2017-08-18 NOTE — Progress Notes (Signed)
Expand All Collapse All       [] Hide copied text  [] Hover for details   Patient ID: Sonya Padilla, female   DOB: 1996/02/15, 22 y.o.   MRN: [id number] Preoperative History and Physical  Sonya Padilla is a 22 y.o. [id number] here for surgical management of sterilization. She has had two vaginal deliveries and she has decided she does not want anymore children. She has been confident for years that she wants permanent sterilizaiton, and has signed tubal forms, and reaffirmed desire for BTL again today No significant preoperative concerns.questions answered.   Proposed surgery: Laparoscopic Tubal Ligation Falope rings      Past Medical History:  Diagnosis Date  . Amenorrhea 10/02/2014  . Broken leg    left  . Depression   . Irregular periods 05/13/2015  . Nausea 03/13/2015  . Obesity   . Pelvic pain in female 05/13/2015  . Pregnant 05/13/2015  . RLQ abdominal pain 10/02/2014  . Suicide ([place])   . Urinary frequency 03/13/2015        Past Surgical History:  Procedure Laterality Date  . ESOPHAGOGASTRODUODENOSCOPY N/A 12/12/2014   CVE:[id number] reflux   . WISDOM TOOTH EXTRACTION Bilateral                    OB History  Gravida Para Term Preterm AB Living  2 2 2     2   SAB TAB Ectopic Multiple Live Births        0 2    # Outcome Date GA Lbr Len/2nd Weight Sex Delivery Anes PTL Lv  2 Term 01/23/16 [redacted]w[redacted]d 11:48 / 00:46 9 lb 8 oz (4.31 kg) M Vag-Spont EPI  LIV  1 Term 04/17/14 [redacted]w[redacted]d  6 lb 15 oz (3.147 kg) F Vag-Spont EPI N LIV    Patient denies any other pertinent gynecologic issues.         Current Outpatient Medications on File Prior to Visit  Medication Sig Dispense Refill  . norethindrone (MICRONOR,CAMILA,ERRIN) 0.35 MG tablet TAKE ONE TABLET BY MOUTH ONCE DAILY 28 tablet 11  . topiramate (TOPAMAX) 50 MG tablet Take 1 tablet (50 mg total) by mouth 2 (two) times daily. 60 tablet 12  . Eluxadoline (VIBERZI) 75 MG TABS Take 1 tablet by mouth daily  with breakfast. (Patient not taking: Reported on 06/23/2017) 90 tablet 3            Current Facility-Administered Medications on File Prior to Visit  Medication Dose Route Frequency Provider Last Rate Last Dose  . gi cocktail (Maalox,Lidocaine,Donnatal)  30 mL Oral Once Dettinger, Fransisca Kaufmann, MD            Allergies  Allergen Reactions  . Tylenol [Acetaminophen] Other (See Comments)    Migraine    Social History:   reports that she quit smoking about 4 years ago. Her smoking use included cigarettes. She has a 1.00 pack-year smoking history. She has quit using smokeless tobacco. Her smokeless tobacco use included chew. She reports that she does not drink alcohol or use drugs.       Family History  Problem Relation Age of Onset  . Hypertension Mother   . Stroke Paternal Aunt   . Diabetes Maternal Grandmother   . Heart disease Maternal Grandmother   . Hyperlipidemia Maternal Grandmother   . Drug abuse Maternal Grandmother   . Glaucoma Maternal Grandmother   . Cataracts Maternal Grandmother   . Cancer Paternal Grandfather   . Colon cancer Paternal Grandfather   .  Psoriasis Sister   . Prostate cancer Maternal Grandfather   . Alcohol abuse Maternal Grandfather   . Other Father        heart issues  . Heart attack Father   . Cervical cancer Maternal Aunt   . Physical abuse Maternal Uncle   . Obesity Other   . Bipolar disorder Other        paternal aunt and cousin  . Drug abuse Other     Review of Systems: Noncontributory  PHYSICAL EXAM: Blood pressure 110/70, pulse 96, height 5\' 5"  (1.651 m), weight 223 lb (101.2 kg), last menstrual period 07/15/2017, not currently breastfeeding. General appearance - alert, well appearing, and in no distress Chest - clear to auscultation, no wheezes, rales or rhonchi, symmetric air entry Heart - normal rate and regular rhythm Abdomen - soft, nontender, nondistended, no masses or organomegaly                       Pelvic - examination              External genitalia- normal             Cervix- multiparous              Uterus- anterior             Adnexa- negative Extremities - peripheral pulses normal, no pedal edema, no clubbing or cyanosis  Labs: No results found for this or any previous visit (from the past 336 hour(s)).  Imaging Studies: ImagingResults  No results found.    Assessment:     Patient Active Problem List   Diagnosis Date Noted  . Encounter for counseling regarding contraception 06/23/2017  . IBS (irritable bowel syndrome) 12/23/2016  . Depression, recurrent ([place]) 08/26/2016  . Nexplanon in place 03/19/2016  . Nexplanon insertion 03/04/2016  . History of postpartum hemorrhage 02/26/2016  . Ulcerative esophagitis 01/30/2015  . Gastroesophageal reflux disease with esophagitis   . Amenorrhea 10/02/2014  . RLQ abdominal pain 10/02/2014  . Migraines w/ aura 07/18/2014  . Obesity (BMI 30-39.9) 10/24/2013  . Smoker 10/24/2013  . Marijuana use 10/24/2013  . 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 Post traumatic stress disorder 08/06/2011    Plan: 1. Patient will undergo surgical management with Laparoscopic Tubal Ligation Falope rings.  2  Intended permanency of procedure, risks, benefits, discussed. Alternatives such as long acting reversible contraception were discussed and rejected by patient.    Plan Surgery on 08/24/17    Jonnie Kind, MD

## 2017-08-19 LAB — COMPREHENSIVE METABOLIC PANEL
AG Ratio: 1.8 (calc) (ref 1.0–2.5)
ALKALINE PHOSPHATASE (APISO): 87 U/L (ref 33–115)
ALT: 15 U/L (ref 6–29)
AST: 13 U/L (ref 10–30)
Albumin: 4.2 g/dL (ref 3.6–5.1)
BUN/Creatinine Ratio: 9 (calc) (ref 6–22)
BUN: 6 mg/dL — AB (ref 7–25)
CHLORIDE: 105 mmol/L (ref 98–110)
CO2: 28 mmol/L (ref 20–32)
CREATININE: 0.7 mg/dL (ref 0.50–1.10)
Calcium: 9.5 mg/dL (ref 8.6–10.2)
Globulin: 2.4 g/dL (calc) (ref 1.9–3.7)
Glucose, Bld: 83 mg/dL (ref 65–139)
Potassium: 4.2 mmol/L (ref 3.5–5.3)
Sodium: 140 mmol/L (ref 135–146)
Total Bilirubin: 0.4 mg/dL (ref 0.2–1.2)
Total Protein: 6.6 g/dL (ref 6.1–8.1)

## 2017-08-19 LAB — CBC WITH DIFFERENTIAL/PLATELET
Basophils Absolute: 18 cells/uL (ref 0–200)
Basophils Relative: 0.2 %
EOS ABS: 151 {cells}/uL (ref 15–500)
Eosinophils Relative: 1.7 %
HCT: 36.1 % (ref 35.0–45.0)
Hemoglobin: 12.7 g/dL (ref 11.7–15.5)
Lymphs Abs: 1994 cells/uL (ref 850–3900)
MCH: 29.8 pg (ref 27.0–33.0)
MCHC: 35.2 g/dL (ref 32.0–36.0)
MCV: 84.7 fL (ref 80.0–100.0)
MPV: 11 fL (ref 7.5–12.5)
Monocytes Relative: 6.2 %
NEUTROS PCT: 69.5 %
Neutro Abs: 6186 cells/uL (ref 1500–7800)
PLATELETS: 226 10*3/uL (ref 140–400)
RBC: 4.26 10*6/uL (ref 3.80–5.10)
RDW: 12.9 % (ref 11.0–15.0)
TOTAL LYMPHOCYTE: 22.4 %
WBC: 8.9 10*3/uL (ref 3.8–10.8)
WBCMIX: 552 {cells}/uL (ref 200–950)

## 2017-08-19 LAB — LIPASE: LIPASE: 10 U/L (ref 7–60)

## 2017-08-24 ENCOUNTER — Ambulatory Visit (HOSPITAL_COMMUNITY)
Admission: RE | Admit: 2017-08-24 | Discharge: 2017-08-24 | Disposition: A | Discharge status: Home / Self Care | Payer: Medicaid Other | Source: Ambulatory Visit | Attending: Obstetrics and Gynecology | Admitting: Obstetrics and Gynecology

## 2017-08-24 ENCOUNTER — Ambulatory Visit (HOSPITAL_COMMUNITY): Payer: Medicaid Other | Admitting: Anesthesiology

## 2017-08-24 ENCOUNTER — Encounter (HOSPITAL_COMMUNITY)
Admission: RE | Disposition: A | Discharge status: Home / Self Care | Payer: Self-pay | Source: Ambulatory Visit | Attending: Obstetrics and Gynecology

## 2017-08-24 ENCOUNTER — Encounter (HOSPITAL_COMMUNITY): Payer: Self-pay | Admitting: *Deleted

## 2017-08-24 DIAGNOSIS — Z6837 Body mass index (BMI) 37.0-37.9, adult: Secondary | ICD-10-CM | POA: Diagnosis not present

## 2017-08-24 DIAGNOSIS — Z302 Encounter for sterilization: Secondary | ICD-10-CM | POA: Insufficient documentation

## 2017-08-24 DIAGNOSIS — G43109 Migraine with aura, not intractable, without status migrainosus: Secondary | ICD-10-CM | POA: Insufficient documentation

## 2017-08-24 DIAGNOSIS — E669 Obesity, unspecified: Secondary | ICD-10-CM | POA: Diagnosis not present

## 2017-08-24 DIAGNOSIS — Z793 Long term (current) use of hormonal contraceptives: Secondary | ICD-10-CM | POA: Insufficient documentation

## 2017-08-24 DIAGNOSIS — Z87891 Personal history of nicotine dependence: Secondary | ICD-10-CM | POA: Diagnosis not present

## 2017-08-24 DIAGNOSIS — Z3009 Encounter for other general counseling and advice on contraception: Secondary | ICD-10-CM | POA: Diagnosis present

## 2017-08-24 HISTORY — PX: LAPAROSCOPIC TUBAL LIGATION: SHX1937

## 2017-08-24 SURGERY — LIGATION, FALLOPIAN TUBE, LAPAROSCOPIC
Anesthesia: General | Laterality: Bilateral

## 2017-08-24 MED ORDER — ONDANSETRON HCL 4 MG/2ML IJ SOLN
INTRAMUSCULAR | Status: AC
  Filled 2017-08-24: qty 2

## 2017-08-24 MED ORDER — MIDAZOLAM HCL 2 MG/2ML IJ SOLN
INTRAMUSCULAR | Status: AC
  Filled 2017-08-24: qty 2

## 2017-08-24 MED ORDER — MIDAZOLAM HCL 5 MG/5ML IJ SOLN
INTRAMUSCULAR | Status: DC | PRN
  Administered 2017-08-24: 2 mg via INTRAVENOUS

## 2017-08-24 MED ORDER — LIDOCAINE HCL 1 % IJ SOLN
INTRAMUSCULAR | Status: DC | PRN
  Administered 2017-08-24: 30 mg via INTRADERMAL

## 2017-08-24 MED ORDER — 0.9 % SODIUM CHLORIDE (POUR BTL) OPTIME
TOPICAL | Status: DC | PRN
  Administered 2017-08-24: 1000 mL

## 2017-08-24 MED ORDER — SUCCINYLCHOLINE CHLORIDE 20 MG/ML IJ SOLN
INTRAMUSCULAR | Status: AC
  Filled 2017-08-24: qty 1

## 2017-08-24 MED ORDER — PROPOFOL 10 MG/ML IV BOLUS
INTRAVENOUS | Status: DC | PRN
Start: 2017-08-24 — End: 2017-08-24
  Administered 2017-08-24: 150 mg via INTRAVENOUS

## 2017-08-24 MED ORDER — BUPIVACAINE HCL (PF) 0.25 % IJ SOLN
INTRAMUSCULAR | Status: AC | PRN
  Administered 2017-08-24: 12 mL/h

## 2017-08-24 MED ORDER — BUPIVACAINE HCL (PF) 0.25 % IJ SOLN
INTRAMUSCULAR | Status: AC
  Filled 2017-08-24: qty 30

## 2017-08-24 MED ORDER — SUGAMMADEX SODIUM 200 MG/2ML IV SOLN
INTRAVENOUS | Status: AC
  Filled 2017-08-24: qty 2

## 2017-08-24 MED ORDER — SUGAMMADEX SODIUM 200 MG/2ML IV SOLN
INTRAVENOUS | Status: DC | PRN
  Administered 2017-08-24: 250 mg via INTRAVENOUS

## 2017-08-24 MED ORDER — LACTATED RINGERS IV SOLN
INTRAVENOUS | Status: DC
  Administered 2017-08-24: 12:00:00 via INTRAVENOUS

## 2017-08-24 MED ORDER — ONDANSETRON HCL 4 MG/2ML IJ SOLN
4.0000 mg | Freq: Once | INTRAMUSCULAR | Status: AC
  Administered 2017-08-24: 4 mg via INTRAVENOUS

## 2017-08-24 MED ORDER — FENTANYL CITRATE (PF) 100 MCG/2ML IJ SOLN
25.0000 ug | INTRAMUSCULAR | Status: DC | PRN
  Administered 2017-08-24 (×2): 50 ug via INTRAVENOUS
  Filled 2017-08-24: qty 2

## 2017-08-24 MED ORDER — FENTANYL CITRATE (PF) 100 MCG/2ML IJ SOLN
INTRAMUSCULAR | Status: AC
  Filled 2017-08-24: qty 4

## 2017-08-24 MED ORDER — ROCURONIUM BROMIDE 50 MG/5ML IV SOLN
INTRAVENOUS | Status: AC
  Filled 2017-08-24: qty 1

## 2017-08-24 MED ORDER — FENTANYL CITRATE (PF) 100 MCG/2ML IJ SOLN
INTRAMUSCULAR | Status: DC | PRN
  Administered 2017-08-24 (×3): 50 ug via INTRAVENOUS

## 2017-08-24 MED ORDER — MIDAZOLAM HCL 2 MG/2ML IJ SOLN
1.0000 mg | INTRAMUSCULAR | Status: AC
  Administered 2017-08-24: 2 mg via INTRAVENOUS

## 2017-08-24 MED ORDER — DEXAMETHASONE SODIUM PHOSPHATE 4 MG/ML IJ SOLN
INTRAMUSCULAR | Status: AC
  Filled 2017-08-24: qty 1

## 2017-08-24 MED ORDER — DEXAMETHASONE SODIUM PHOSPHATE 4 MG/ML IJ SOLN
4.0000 mg | Freq: Once | INTRAMUSCULAR | Status: AC
  Administered 2017-08-24: 4 mg via INTRAVENOUS

## 2017-08-24 MED ORDER — OXYCODONE HCL 5 MG PO TABS: 5.0000 mg | ORAL_TABLET | ORAL | 0 refills | Status: DC | PRN

## 2017-08-24 MED ORDER — ROCURONIUM BROMIDE 100 MG/10ML IV SOLN
INTRAVENOUS | Status: DC | PRN
  Administered 2017-08-24: 30 mg via INTRAVENOUS

## 2017-08-24 SURGICAL SUPPLY — 40 items
BAG HAMPER (MISCELLANEOUS) ×3 IMPLANT
BANDAGE STRIP 1X3 FLEXIBLE (GAUZE/BANDAGES/DRESSINGS) ×6 IMPLANT
BLADE SURG SZ11 CARB STEEL (BLADE) ×3 IMPLANT
CATH ROBINSON RED A/P 16FR (CATHETERS) ×3 IMPLANT
CLOSURE WOUND 1/4 X3 (GAUZE/BANDAGES/DRESSINGS) ×1
CLOTH BEACON ORANGE TIMEOUT ST (SAFETY) ×3 IMPLANT
COVER LIGHT HANDLE STERIS (MISCELLANEOUS) ×6 IMPLANT
DECANTER SPIKE VIAL GLASS SM (MISCELLANEOUS) ×3 IMPLANT
DURAPREP 26ML APPLICATOR (WOUND CARE) ×3 IMPLANT
ELECT REM PT RETURN 9FT ADLT (ELECTROSURGICAL) ×3
ELECTRODE REM PT RTRN 9FT ADLT (ELECTROSURGICAL) ×1 IMPLANT
GLOVE BIOGEL PI IND STRL 6.5 (GLOVE) ×1 IMPLANT
GLOVE BIOGEL PI IND STRL 7.0 (GLOVE) ×3 IMPLANT
GLOVE BIOGEL PI IND STRL 9 (GLOVE) ×1 IMPLANT
GLOVE BIOGEL PI INDICATOR 6.5 (GLOVE) ×2
GLOVE BIOGEL PI INDICATOR 7.0 (GLOVE) ×6
GLOVE BIOGEL PI INDICATOR 9 (GLOVE) ×2
GLOVE ECLIPSE 9.0 STRL (GLOVE) ×3 IMPLANT
GLOVE SURG SS PI 6.5 STRL IVOR (GLOVE) ×3 IMPLANT
GOWN SPEC L3 XXLG W/TWL (GOWN DISPOSABLE) ×3 IMPLANT
GOWN STRL REUS W/TWL LRG LVL3 (GOWN DISPOSABLE) ×3 IMPLANT
INST SET LAPROSCOPIC GYN AP (KITS) ×3 IMPLANT
KIT ROOM TURNOVER AP CYSTO (KITS) ×3 IMPLANT
MANIFOLD NEPTUNE II (INSTRUMENTS) ×3 IMPLANT
NEEDLE INSUFFLATION 14GA 120MM (NEEDLE) ×3 IMPLANT
NS IRRIG 1000ML POUR BTL (IV SOLUTION) ×3 IMPLANT
PACK PERI GYN (CUSTOM PROCEDURE TRAY) ×3 IMPLANT
PAD ARMBOARD 7.5X6 YLW CONV (MISCELLANEOUS) ×3 IMPLANT
RING FALLOPIAN BANDS (Ring) ×3 IMPLANT
SET BASIN LINEN APH (SET/KITS/TRAYS/PACK) ×3 IMPLANT
SOLUTION ANTI FOG 6CC (MISCELLANEOUS) ×3 IMPLANT
STRIP CLOSURE SKIN 1/4X3 (GAUZE/BANDAGES/DRESSINGS) ×2 IMPLANT
SUT VIC AB 4-0 PS2 27 (SUTURE) ×3 IMPLANT
SYR BULB IRRIGATION 50ML (SYRINGE) ×3 IMPLANT
SYR CONTROL 10ML LL (SYRINGE) ×3 IMPLANT
SYRINGE 10CC LL (SYRINGE) ×6 IMPLANT
TROCAR KII 8X100ML NONTHREADED (TROCAR) ×3 IMPLANT
TROCAR XCEL NON-BLD 5MMX100MML (ENDOMECHANICALS) ×3 IMPLANT
TUBING INSUFFLATION (TUBING) ×3 IMPLANT
WARMER LAPAROSCOPE (MISCELLANEOUS) ×3 IMPLANT

## 2017-08-24 NOTE — Anesthesia Preprocedure Evaluation (Signed)
Anesthesia Evaluation  Patient identified by MRN, date of birth, ID band Patient awake    Reviewed: Allergy & Precautions, NPO status , Patient's Chart, lab work & pertinent test results  Airway Mallampati: II  TM Distance: >3 FB Neck ROM: Full    Dental  (+) Teeth Intact   Pulmonary former smoker,    breath sounds clear to auscultation       Cardiovascular negative cardio ROS   Rhythm:Regular Rate:Normal     Neuro/Psych  Headaches, PSYCHIATRIC DISORDERS (PTSD) Anxiety Depression    GI/Hepatic PUD, neg GERD  ,  Endo/Other    Renal/GU      Musculoskeletal   Abdominal   Peds  Hematology   Anesthesia Other Findings   Reproductive/Obstetrics                             Anesthesia Physical Anesthesia Plan  ASA: II  Anesthesia Plan: General   Post-op Pain Management:    Induction: Intravenous  PONV Risk Score and Plan:   Airway Management Planned: Oral ETT  Additional Equipment:   Intra-op Plan:   Post-operative Plan: Extubation in OR  Informed Consent: I have reviewed the patients History and Physical, chart, labs and discussed the procedure including the risks, benefits and alternatives for the proposed anesthesia with the patient or authorized representative who has indicated his/her understanding and acceptance.     Plan Discussed with:   Anesthesia Plan Comments:         Anesthesia Quick Evaluation

## 2017-08-24 NOTE — Transfer of Care (Signed)
Immediate Anesthesia Transfer of Care Note  Patient: Sonya Padilla  Procedure(s) Performed: LAPAROSCOPIC TUBAL STERILIZATION, FALLOPE RING APPLICATION (Bilateral )  Patient Location: PACU  Anesthesia Type:General  Level of Consciousness: awake and patient cooperative  Airway & Oxygen Therapy: Patient Spontanous Breathing and Patient connected to face mask oxygen  Post-op Assessment: Report given to RN, Post -op Vital signs reviewed and stable and Patient moving all extremities  Post vital signs: Reviewed and stable  Last Vitals:  Vitals:   08/24/17 1310 08/24/17 1315  BP: 102/60 101/61  Resp: 17 (!) 24  Temp:    SpO2: 100% 99%    Last Pain:  Vitals:   08/24/17 1128  TempSrc: Oral         Complications: No apparent anesthesia complications

## 2017-08-24 NOTE — H&P (Signed)
Preoperative History and Physical  Sonya Padilla is a 22 y.o. [id number] here for surgical management of sterilization. She has had two vaginal deliveries and she has decided she does not want anymore children. She has been confident for years that she wants permanent sterilizaiton, and has signed tubal forms, and reaffirmed desire for BTL again today No significant preoperative concerns.questions answered.   Proposed surgery: Laparoscopic Tubal Ligation Falope rings      Past Medical History:  Diagnosis Date  . Amenorrhea 10/02/2014  . Broken leg    left  . Depression   . Irregular periods 05/13/2015  . Nausea 03/13/2015  . Obesity   . Pelvic pain in female 05/13/2015  . Pregnant 05/13/2015  . RLQ abdominal pain 10/02/2014  . Suicide ([place])   . Urinary frequency 03/13/2015        Past Surgical History:  Procedure Laterality Date  . ESOPHAGOGASTRODUODENOSCOPY N/A 12/12/2014   [id number] reflux   . WISDOM TOOTH EXTRACTION Bilateral                    OB History  Gravida Para Term Preterm AB Living  2 2 2     2   SAB TAB Ectopic Multiple Live Births        0 2    # Outcome Date GA Lbr Len/2nd Weight Sex Delivery Anes PTL Lv  2 Term 01/23/16 [redacted]w[redacted]d 11:48 / 00:46 9 lb 8 oz (4.31 kg) M Vag-Spont EPI  LIV  1 Term 04/17/14 [redacted]w[redacted]d  6 lb 15 oz (3.147 kg) F Vag-Spont EPI N LIV    Patient denies any other pertinent gynecologic issues.         Current Outpatient Medications on File Prior to Visit  Medication Sig Dispense Refill  . norethindrone (MICRONOR,CAMILA,ERRIN) 0.35 MG tablet TAKE ONE TABLET BY MOUTH ONCE DAILY 28 tablet 11  . topiramate (TOPAMAX) 50 MG tablet Take 1 tablet (50 mg total) by mouth 2 (two) times daily. 60 tablet 12  . Eluxadoline (VIBERZI) 75 MG TABS Take 1 tablet by mouth daily with breakfast. (Patient not taking: Reported on 06/23/2017) 90 tablet 3            Current Facility-Administered Medications on File Prior to Visit  Medication  Dose Route Frequency Provider Last Rate Last Dose  . gi cocktail (Maalox,Lidocaine,Donnatal)  30 mL Oral Once Dettinger, Fransisca Kaufmann, MD            Allergies  Allergen Reactions  . Tylenol [Acetaminophen] Other (See Comments)    Migraine    Social History:   reports that she quit smoking about 4 years ago. Her smoking use included cigarettes. She has a 1.00 pack-year smoking history. She has quit using smokeless tobacco. Her smokeless tobacco use included chew. She reports that she does not drink alcohol or use drugs.       Family History  Problem Relation Age of Onset  . Hypertension Mother   . Stroke Paternal Aunt   . Diabetes Maternal Grandmother   . Heart disease Maternal Grandmother   . Hyperlipidemia Maternal Grandmother   . Drug abuse Maternal Grandmother   . Glaucoma Maternal Grandmother   . Cataracts Maternal Grandmother   . Cancer Paternal Grandfather   . Colon cancer Paternal Grandfather   . Psoriasis Sister   . Prostate cancer Maternal Grandfather   . Alcohol abuse Maternal Grandfather   . Other Father        heart issues  . Heart attack  Father   . Cervical cancer Maternal Aunt   . Physical abuse Maternal Uncle   . Obesity Other   . Bipolar disorder Other        paternal aunt and cousin  . Drug abuse Other     Review of Systems: Noncontributory  PHYSICAL EXAM: Blood pressure 110/70, pulse 96, height 5\' 5"  (1.651 m), weight 223 lb (101.2 kg), last menstrual period 07/15/2017, not currently breastfeeding. General appearance - alert, well appearing, and in no distress Chest - clear to auscultation, no wheezes, rales or rhonchi, symmetric air entry Heart - normal rate and regular rhythm Abdomen - soft, nontender, nondistended, no masses or organomegaly                     Pelvic - examination              External genitalia- normal             Cervix- multiparous              Uterus- anterior             Adnexa-  negative Extremities - peripheral pulses normal, no pedal edema, no clubbing or cyanosis  Labs: CBC    Component Value Date/Time   WBC 8.9 08/18/2017 1547   RBC 4.26 08/18/2017 1547   HGB 12.7 08/18/2017 1547   HGB 14.3 06/09/2017 1447   HCT 36.1 08/18/2017 1547   HCT 42.0 06/09/2017 1447   PLT 226 08/18/2017 1547   PLT 246 06/09/2017 1447   MCV 84.7 08/18/2017 1547   MCV 85 06/09/2017 1447   MCH 29.8 08/18/2017 1547   MCHC 35.2 08/18/2017 1547   RDW 12.9 08/18/2017 1547   RDW 14.4 06/09/2017 1447   LYMPHSABS 1,994 08/18/2017 1547   LYMPHSABS 2.8 06/09/2017 1447   EOSABS 151 08/18/2017 1547   EOSABS 0.4 06/09/2017 1447   BASOSABS 18 08/18/2017 1547   BASOSABS 0.0 06/09/2017 1447    CMP Latest Ref Rng & Units 08/18/2017 06/09/2017 08/03/2016  Glucose 65 - 139 mg/dL 83 105(H) 105(H)  BUN 7 - 25 mg/dL 6(L) 7 7  Creatinine 0.50 - 1.10 mg/dL 0.70 0.72 0.61  Sodium 135 - 146 mmol/L 140 140 141  Potassium 3.5 - 5.3 mmol/L 4.2 4.0 4.0  Chloride 98 - 110 mmol/L 105 108(H) 103  CO2 20 - 32 mmol/L 28 19(L) 23  Calcium 8.6 - 10.2 mg/dL 9.5 9.3 9.1  Total Protein 6.1 - 8.1 g/dL 6.6 6.9 -  Total Bilirubin 0.2 - 1.2 mg/dL 0.4 0.3 -  Alkaline Phos 39 - 117 IU/L - 105 -  AST 10 - 30 U/L 13 13 -  ALT 6 - 29 U/L 15 14 -      Imaging Studies: ImagingResults  No results found.    Assessment:     Patient Active Problem List   Diagnosis Date Noted  . Encounter for counseling regarding contraception 06/23/2017  . IBS (irritable bowel syndrome) 12/23/2016  . Depression, recurrent ([place]) 08/26/2016  . Nexplanon in place 03/19/2016  . Nexplanon insertion 03/04/2016  . History of postpartum hemorrhage 02/26/2016  . Ulcerative esophagitis 01/30/2015  . Gastroesophageal reflux disease with esophagitis   . Amenorrhea 10/02/2014  . RLQ abdominal pain 10/02/2014  . Migraines w/ aura 07/18/2014  . Obesity (BMI 30-39.9) 10/24/2013  . Smoker 10/24/2013  . Marijuana use 10/24/2013   . 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 Post traumatic stress disorder 08/06/2011  Plan: 1. Patient will undergo surgical management with Laparoscopic Tubal Ligation Falope rings.  2. The patient has been counseled extensively on the intended permanency of this procedure, the low reversibility of procedure, the non-permanent methods that exist for contraception. She is calmly confident of her decisions through all the discussions of this and prior visits.   Jonnie Kind, MD  08/11/2017 11:09 AM

## 2017-08-24 NOTE — Anesthesia Postprocedure Evaluation (Signed)
Anesthesia Post Note  Patient: Sonya Padilla  Procedure(s) Performed: LAPAROSCOPIC TUBAL STERILIZATION, FALLOPE RING APPLICATION (Bilateral )  Patient location during evaluation: PACU Anesthesia Type: General Level of consciousness: awake and patient cooperative Pain management: pain level controlled Vital Signs Assessment: post-procedure vital signs reviewed and stable Respiratory status: spontaneous breathing, nonlabored ventilation and respiratory function stable Cardiovascular status: blood pressure returned to baseline Postop Assessment: no apparent nausea or vomiting Anesthetic complications: no     Last Vitals:  Vitals:   08/24/17 1315 08/24/17 1415  BP: 101/61 126/71  Pulse:  87  Resp: (!) 24 (!) 21  Temp:  36.8 C  SpO2: 99% 97%    Last Pain:  Vitals:   08/24/17 1415  TempSrc:   PainSc: 6                  Fidencia Mccloud J

## 2017-08-24 NOTE — Anesthesia Procedure Notes (Signed)
Procedure Name: Intubation Date/Time: 08/24/2017 1:27 PM Performed by: Charmaine Downs, CRNA Pre-anesthesia Checklist: Patient identified, Patient being monitored, Emergency Drugs available, Timeout performed and Suction available Patient Re-evaluated:Patient Re-evaluated prior to induction Oxygen Delivery Method: Circle System Utilized Preoxygenation: Pre-oxygenation with 100% oxygen Induction Type: IV induction Ventilation: Mask ventilation without difficulty Laryngoscope Size: Mac and 4 Grade View: Grade II Number of attempts: 1 Placement Confirmation: positive ETCO2,  breath sounds checked- equal and bilateral and ETT inserted through vocal cords under direct vision Secured at: 22 cm Tube secured with: Tape Dental Injury: Teeth and Oropharynx as per pre-operative assessment

## 2017-08-24 NOTE — Op Note (Addendum)
Please see the brief operative note for surgical details 

## 2017-08-24 NOTE — Brief Op Note (Addendum)
08/24/2017  2:02 PM  PATIENT:  Sonya Padilla  22 y.o. female  PRE-OPERATIVE DIAGNOSIS:  Requested Permanent Sterilization  POST-OPERATIVE DIAGNOSIS:  Requested Permanent Sterilization  PROCEDURE:  Procedure(s): LAPAROSCOPIC TUBAL STERILIZATION, FALLOPE RING APPLICATION (Bilateral)  SURGEON:  Surgeon(s) and Role:    Jonnie Kind, MD - Primary  PHYSICIAN ASSISTANT:   ASSISTANTS: none   ANESTHESIA:   local  EBL:  5 mL   BLOOD ADMINISTERED:none  DRAINS: none   LOCAL MEDICATIONS USED:  MARCAINE     SPECIMEN:  No Specimen  DISPOSITION OF SPECIMEN:  N/A  COUNTS:  YES  TOURNIQUET:  * No tourniquets in log *  DICTATION: .Dragon Dictation  PLAN OF CARE: Discharge to home after PACU  PATIENT DISPOSITION:  PACU - hemodynamically stable.   Delay start of Pharmacological VTE agent (>24hrs) due to surgical blood loss or risk of bleeding: not applicable Details of procedure.  Patient was taken the operating room prepped and draped for combined abdominal and vaginal procedure with in and out catheterization of the bladder timeout conducted and confirmed by surgical team.  The cervix was grasped with single-tooth tenaculum for uterine manipulation.  The abdomen was prepped and draped.  The infraumbilical vertical 1 cm in skin incision was made, and a transverse suprapubic incision of slightly longer length made.  The Veress needle was used through the umbilicus under 6 mm opening pressure using water droplet technique to confirm intraperitoneal free flow and the abdomen was insufflated to 3 L CO2 allowing introduction of a 5 mm laparoscopic trocar under direct visualization with attention then directed to the suprapubic area which was then allowed to insert the 8 mm trocar without difficulty.  Attention was directed to the left fallopian tube were midsegment knuckle of the tube could be identified tube confirmed to the fimbriated end, the knuckle of tube pulled up into the Falope  ring applicator and fired placing a Falope ring on the mid segment of the left tube.  Marcaine was injected percutaneously with a spinal needle to infiltrate the incarcerated knuckle of tube in the mesosalpinx below the Falope ring applier.  Patient was then had the same procedure performed on the opposite tube.  Photos were taken to document the correct positioning of the rings.  The abdomen was deflated saline times 120 cc instilled in the to assist with evacuation of carbon dioxide, then the abdomen deflated trochars removed and subcuticular 4-0 Vicryl closure of the skin incision was performed sponge and needle counts were correct instruments counts were correct

## 2017-08-24 NOTE — Discharge Instructions (Signed)
Laparoscopic Tubal Ligation, Care After °Refer to this sheet in the next few weeks. These instructions provide you with information about caring for yourself after your procedure. Your health care provider may also give you more specific instructions. Your treatment has been planned according to current medical practices, but problems sometimes occur. Call your health care provider if you have any problems or questions after your procedure. °What can I expect after the procedure? °After the procedure, it is common to have: °· A sore throat. °· Discomfort in your shoulder. °· Mild discomfort or cramping in your abdomen. °· Gas pains. °· Pain or soreness in the area where the surgical cut (incision) was made. °· A bloated feeling. °· Tiredness. °· Nausea. °· Vomiting. ° °Follow these instructions at home: °Medicines °· Take over-the-counter and prescription medicines only as told by your health care provider. °· Do not take aspirin because it can cause bleeding. °· Do not drive or operate heavy machinery while taking prescription pain medicine. °Activity °· Rest for the rest of the day. °· Return to your normal activities as told by your health care provider. Ask your health care provider what activities are safe for you. °Incision care ° °· Follow instructions from your health care provider about how to take care of your incision. Make sure you: °? Wash your hands with soap and water before you change your bandage (dressing). If soap and water are not available, use hand sanitizer. °? Change your dressing as told by your health care provider. °? Leave stitches (sutures) in place. They may need to stay in place for 2 weeks or longer. °· Check your incision area every day for signs of infection. Check for: °? More redness, swelling, or pain. °? More fluid or blood. °? Warmth. °? Pus or a bad smell. °Other Instructions °· Do not take baths, swim, or use a hot tub until your health care provider approves. You may take  showers. °· Keep all follow-up visits as told by your health care provider. This is important. °· Have someone help you with your daily household tasks for the first few days. °Contact a health care provider if: °· You have more redness, swelling, or pain around your incision. °· Your incision feels warm to the touch. °· You have pus or a bad smell coming from your incision. °· The edges of your incision break open after the sutures have been removed. °· Your pain does not improve after 2-3 days. °· You have a rash. °· You repeatedly become dizzy or light-headed. °· Your pain medicine is not helping. °· You are constipated. °Get help right away if: °· You have a fever. °· You faint. °· You have increasing pain in your abdomen. °· You have severe pain in one or both of your shoulders. °· You have fluid or blood coming from your sutures or from your vagina. °· You have shortness of breath or difficulty breathing. °· You have chest pain or leg pain. °· You have ongoing nausea, vomiting, or diarrhea. °This information is not intended to replace advice given to you by your health care provider. Make sure you discuss any questions you have with your health care provider. °Document Released: 01/16/2005 Document Revised: 12/02/2015 Document Reviewed: 06/09/2015 °Elsevier Interactive Patient Education © 2018 [employer] Inc. ° °

## 2017-08-25 ENCOUNTER — Ambulatory Visit: Payer: Medicaid Other | Admitting: Diagnostic Neuroimaging

## 2017-08-26 ENCOUNTER — Encounter (HOSPITAL_COMMUNITY): Payer: Self-pay | Admitting: Obstetrics and Gynecology

## 2017-08-30 NOTE — Patient Instructions (Signed)
PA info for CT abd/pelvis w/contrast submitted via [facility]. Case approved. PA# [id number], 08/30/17-09/29/17.

## 2017-09-08 ENCOUNTER — Ambulatory Visit (INDEPENDENT_AMBULATORY_CARE_PROVIDER_SITE_OTHER): Payer: Medicaid Other | Admitting: Obstetrics and Gynecology

## 2017-09-08 ENCOUNTER — Encounter: Payer: Self-pay | Admitting: Obstetrics and Gynecology

## 2017-09-08 VITALS — BP 110/60 | HR 84 | Ht 65.0 in | Wt 228.4 lb

## 2017-09-08 DIAGNOSIS — Z09 Encounter for follow-up examination after completed treatment for conditions other than malignant neoplasm: Secondary | ICD-10-CM

## 2017-09-08 DIAGNOSIS — Z9889 Other specified postprocedural states: Secondary | ICD-10-CM

## 2017-09-08 NOTE — Progress Notes (Addendum)
° °  Subjective:  Sonya Padilla is a 22 y.o. female now 2 weeks status post laparoscopic tubal sterilization, bilateral fallopian ring application.     Review of Systems Negative   Diet:   normal   Bowel movements : normal.  The patient is not having any pain.  Objective:  BP 110/60 (BP Location: Right Arm, Patient Position: Sitting, Cuff Size: Large)    Pulse 84    Ht 5\' 5"  (1.651 m)    Wt 228 lb 6.4 oz (103.6 kg)    LMP 08/17/2017 (Exact Date)    Breastfeeding? No    BMI 38.01 kg/m  General:Well developed, well nourished.  No acute distress. Abdomen: Bowel sounds normal, soft, non-tender. Pelvic Exam:  Incision(s):   Healing well, no drainage, no erythema, no hernia, no swelling, no dehiscence,     Assessment:  Post-Op 2 weeks s/p laparoscopic tubal sterilization, bilateral fallopian ring application     Doing well postoperatively.   Plan:  1.Wound care discussed  2. . current medications. 3. Activity restrictions: none 4. return to work: not applicable. 5. Follow up in 4 weeks.   By signing my name below, I, [name], attest that this documentation has been prepared under the direction and in the presence of Jonnie Kind, MD. Electronically Signed: Jabier Gauss, Medical Scribe. 09/08/17. 2:49 PM.  I personally performed the services described in this documentation, which was SCRIBED in my presence. The recorded information has been reviewed and considered accurate. It has been edited as necessary during review. Jonnie Kind, MD  I personally performed the services described in this documentation, which was SCRIBED in my presence. The recorded information has been reviewed and considered accurate. It has been edited as necessary during review. Jonnie Kind, MD

## 2017-09-09 NOTE — Progress Notes (Signed)
Note documented by Jabier Gauss and scribed below.

## 2017-09-15 ENCOUNTER — Ambulatory Visit: Payer: Medicaid Other | Admitting: Diagnostic Neuroimaging

## 2017-09-15 ENCOUNTER — Encounter: Payer: Self-pay | Admitting: Diagnostic Neuroimaging

## 2017-09-15 VITALS — BP 125/71 | HR 82 | Ht 65.0 in | Wt 228.4 lb

## 2017-09-15 DIAGNOSIS — H539 Unspecified visual disturbance: Secondary | ICD-10-CM | POA: Diagnosis not present

## 2017-09-15 DIAGNOSIS — G43809 Other migraine, not intractable, without status migrainosus: Secondary | ICD-10-CM

## 2017-09-15 MED ORDER — TOPIRAMATE 50 MG PO TABS: 50.0000 mg | ORAL_TABLET | Freq: Two times a day (BID) | ORAL | 4 refills | Status: DC

## 2017-09-15 NOTE — Progress Notes (Signed)
[facility]  PATIENT: Sonya Padilla DOB: [birth year]  REFERRING CLINICIAN: Alen Bleacher, MD HISTORY FROM: patient and SO REASON FOR VISIT: follow up    HISTORICAL  CHIEF COMPLAINT:  Chief Complaint  Patient presents with  . Follow-up  . transient L eye blurriness    no episodes from last time in  . Headache    c/o pain from jaw to temple (bilaterally) from wisdome teeth.  Tolerating topamax well.Carotid US not done (did not receive call, did not go to correct work [place]:   UPDATE (09/15/17, [facility]P): Since last visit, doing well. Tolerating TPX. No alleviating or aggravating factors. HA and vision changes are improved. LP done and had normal opening pressure (12cm H2O). Carotid u/s not done since last visit.  PRIOR HPI (04/21/17): 22 year old right-handed female here for evaluation of transient visual loss. For past 4 months patient has had intermittent episodes of left eye vision loss, lasting 15-30 minutes at a time, typically in the evening. This is usually followed by a headache on the left side. These attacks occurred for 3 months in a row. For the past 1 month she has not had any attacks. Patient had an eye exam which was unremarkable. She saw PCP who ordered MRI of the brain. 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  No prior history of headaches. No family history of migraine. Patient has baseline weight of 200, went up to 260 pounds during pregnancy, and now weighs 230 pounds.    REVIEW OF SYSTEMS: Full 14 system review of systems performed and negative with exception of: Ear pain.   ALLERGIES: Allergies  Allergen Reactions  . Tylenol [Acetaminophen] Other (See Comments)    Migraine    HOME MEDICATIONS: Outpatient Medications Prior to Visit  Medication Sig Dispense Refill  . ferrous sulfate 325 (65 FE) MG tablet Take 325 mg by mouth daily as  needed.    Marland Kitchen oxyCODONE (OXY IR/ROXICODONE) 5 MG immediate release tablet Take 1 tablet (5 mg total) by mouth every 4 (four) hours as needed for severe pain. 10 tablet 0  . topiramate (TOPAMAX) 50 MG tablet Take 1 tablet (50 mg total) by mouth 2 (two) times daily. 60 tablet 12   Facility-Administered Medications Prior to Visit  Medication Dose Route Frequency Provider Last Rate Last Dose  . gi cocktail (Maalox,Lidocaine,Donnatal)  30 mL Oral Once Dettinger, Fransisca Kaufmann, MD        PAST MEDICAL HISTORY: Past Medical History:  Diagnosis Date  . Amenorrhea 10/02/2014  . Anxiety   . Broken leg    left  . Depression   . Headache   . Irregular periods 05/13/2015  . Nausea 03/13/2015  . Obesity   . Pelvic pain in female 05/13/2015  . Pregnant 05/13/2015  . RLQ abdominal pain 10/02/2014  . Suicide ([place])   . Urinary frequency 03/13/2015    PAST SURGICAL HISTORY: Past Surgical History:  Procedure Laterality Date  . ESOPHAGOGASTRODUODENOSCOPY N/A 12/12/2014   QQP:[id number] reflux   . LAPAROSCOPIC TUBAL LIGATION Bilateral 08/24/2017   Procedure: LAPAROSCOPIC TUBAL STERILIZATION, FALLOPE RING APPLICATION;  Surgeon: Jonnie Kind, MD;  Location: [facility];  Service: Gynecology;  Laterality: Bilateral;  . WISDOM TOOTH EXTRACTION Bilateral     FAMILY HISTORY: Family History  Problem Relation Age of Onset  . Hypertension Mother   . Stroke Paternal Aunt   .  Diabetes Maternal Grandmother   . Heart disease Maternal Grandmother   . Hyperlipidemia Maternal Grandmother   . Drug abuse Maternal Grandmother   . Glaucoma Maternal Grandmother   . Cataracts Maternal Grandmother   . Cancer Paternal Grandfather   . Colon cancer Paternal Grandfather   . Psoriasis Sister   . Prostate cancer Maternal Grandfather   . Alcohol abuse Maternal Grandfather   . Other Father        heart issues  . Heart attack Father   . Cervical cancer Maternal Aunt   . Physical abuse Maternal Uncle   . Obesity Other   .  Bipolar disorder Other        paternal aunt and cousin  . Drug abuse Other     SOCIAL HISTORY:  Social History   Socioeconomic History  . Marital status: Single    Spouse name: Not on file  . Number of children: 2  . Years of education: GED  . Highest education level: Not on file  Social Needs  . Financial resource strain: Not on file  . Food insecurity - worry: Not on file  . Food insecurity - inability: Not on file  . Transportation needs - medical: Not on file  . Transportation needs - non-medical: Not on file  Occupational History    Comment: NA  Tobacco Use  . Smoking status: Former Smoker    Packs/day: 0.50    Years: 2.00    Pack years: 1.00    Types: Cigarettes    Last attempt to quit: 11/27/2012    Years since quitting: 4.8  . Smokeless tobacco: Former Systems developer    Types: Chew  . Tobacco comment: quit in 04/2015  Substance and Sexual Activity  . Alcohol use: No    Alcohol/week: 0.0 oz  . Drug use: No  . Sexual activity: Yes    Partners: Male    Birth control/protection: Surgical    Comment: tubal  Other Topics Concern  . Not on file  Social History Narrative   Lives with sig other, son, daughter   Caffeine- coffee, 4- 16 oz daily     PHYSICAL EXAM  GENERAL EXAM/CONSTITUTIONAL: Vitals:  Vitals:   09/15/17 1419  BP: 125/71  Pulse: 82  Weight: 228 lb 6.4 oz (103.6 kg)  Height: 5\' 5"  (1.651 m)   Body mass index is 38.01 kg/m.  Visual Acuity Screening   Right eye Left eye Both eyes  Without correction: 20/30 20/30   With correction:       Patient is in no distress; well developed, nourished and groomed; neck is supple  CARDIOVASCULAR:  Examination of carotid arteries is normal; no carotid bruits  Regular rate and rhythm, no murmurs  Examination of peripheral vascular system by observation and palpation is normal  EYES:  Ophthalmoscopic exam of optic discs and posterior segments is normal; no hemorrhages  MUSCULOSKELETAL:  Gait,  strength, tone, movements noted in Neurologic exam below  NEUROLOGIC: MENTAL STATUS:  No flowsheet data found.  awake, alert, oriented to person, place and time  recent and remote memory intact  normal attention and concentration  language fluent, comprehension intact, naming intact,   fund of knowledge appropriate  CRANIAL NERVE:   2nd - no papilledema  2nd, 3rd, 4th, 6th - pupils equal and reactive to light, visual fields full to confrontation, extraocular muscles intact, no nystagmus  5th - facial sensation symmetric  7th - facial strength symmetric  8th - hearing intact  9th - palate  elevates symmetrically, uvula midline  11th - shoulder shrug symmetric  12th - tongue protrusion midline  MOTOR:   normal bulk and tone, full strength in the BUE, BLE  SENSORY:   normal and symmetric to light touch, temperature, vibration  COORDINATION:   finger-nose-finger, fine finger movements normal  REFLEXES:   deep tendon reflexes present and symmetric  GAIT/STATION:   narrow based gait    DIAGNOSTIC DATA (LABS, IMAGING, TESTING) - I reviewed patient records, labs, notes, testing and imaging myself where available.  Lab Results  Component Value Date   WBC 8.9 08/18/2017   HGB 12.7 08/18/2017   HCT 36.1 08/18/2017   MCV 84.7 08/18/2017   PLT 226 08/18/2017      Component Value Date/Time   NA 140 08/18/2017 1547   NA 140 06/09/2017 1447   K 4.2 08/18/2017 1547   CL 105 08/18/2017 1547   CO2 28 08/18/2017 1547   GLUCOSE 83 08/18/2017 1547   BUN 6 (L) 08/18/2017 1547   BUN 7 06/09/2017 1447   CREATININE 0.70 08/18/2017 1547   CALCIUM 9.5 08/18/2017 1547   PROT 6.6 08/18/2017 1547   PROT 6.9 06/09/2017 1447   ALBUMIN 4.4 06/09/2017 1447   AST 13 08/18/2017 1547   ALT 15 08/18/2017 1547   ALKPHOS 105 06/09/2017 1447   BILITOT 0.4 08/18/2017 1547   BILITOT 0.3 06/09/2017 1447   GFRNONAA 121 06/09/2017 1447   GFRAA 139 06/09/2017 1447   Lab  Results  Component Value Date   CHOL 126 08/03/2016   HDL 42 08/03/2016   LDLCALC 72 08/03/2016   TRIG 62 08/03/2016   CHOLHDL 3.0 08/03/2016   No results found for: HGBA1C No results found for: VITAMINB12 Lab Results  Component Value Date   TSH 2.710 03/13/2015    03/03/17 MRI brain [I reviewed images myself and agree with interpretation. -VRP]  - Normal examination. 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 No abnormality seen to explain the presenting symptoms.   05/19/17 lumbar puncture - Technically satisfactory lumbar puncture. Normal opening pressure of 12. Clear colorless CSF.     ASSESSMENT AND PLAN  22 y.o. year old female here with new onset transient left eye visual loss associated with headache, likely migraine variant. TIA less likely.    Dx: migraine variant vs carotid stenosis / TIA  1. Transient vision disturbance of left eye   2. Migraine variant      PLAN:  MIGRAINE WITH AURA - continue topiramate 50mg  twice a day; drink plenty of water   Transient vision loss - check carotid u/s  Meds ordered this encounter  Medications  . topiramate (TOPAMAX) 50 MG tablet    Sig: Take 1 tablet (50 mg total) by mouth 2 (two) times daily.    Dispense:  180 tablet    Refill:  4   Return in about 9 months (around 06/17/2018) for with NP.    Penni Bombard, MD 10/14/9673, 9:16 PM Certified in Neurology, Neurophysiology and [facility] [address] [place],  [address] [phone]

## 2017-09-20 ENCOUNTER — Ambulatory Visit (HOSPITAL_COMMUNITY): Payer: Medicaid Other

## 2017-10-18 ENCOUNTER — Telehealth: Payer: Self-pay | Admitting: Diagnostic Neuroimaging

## 2017-10-18 NOTE — Telephone Encounter (Addendum)
I called patient and left her message stating please call me back her apt is  Arrive at  9:00  For 9:30 10/21/2017. Please call me back with details [phone]. Opt.3 .  I called [facility] to Verify time of her Doppler.

## 2017-10-18 NOTE — Telephone Encounter (Signed)
Pt called to schedule carotid. Please call to advise

## 2017-10-26 ENCOUNTER — Ambulatory Visit: Payer: Medicaid Other | Admitting: Internal Medicine

## 2017-11-12 ENCOUNTER — Ambulatory Visit: Payer: Medicaid Other | Admitting: Family Medicine

## 2017-11-17 ENCOUNTER — Telehealth: Payer: Self-pay | Admitting: Diagnostic Neuroimaging

## 2017-11-17 NOTE — Telephone Encounter (Signed)
Patient requesting doppler results.

## 2017-11-17 NOTE — Telephone Encounter (Signed)
Spoke with patient. Discussed that it does not appear that we have the doppler results yet. However will look into this and give the patient a call back. Pt appreciative. She stated that she had it done on 4/17.

## 2017-11-18 NOTE — Telephone Encounter (Signed)
Pt Carotid doppler report will be faxed over today.

## 2017-11-22 ENCOUNTER — Encounter: Payer: Self-pay | Admitting: Family Medicine

## 2017-11-22 ENCOUNTER — Ambulatory Visit: Payer: Medicaid Other | Admitting: Family Medicine

## 2017-11-22 VITALS — BP 125/90 | HR 79 | Temp 97.3°F | Ht 65.0 in | Wt 225.8 lb

## 2017-11-22 DIAGNOSIS — E669 Obesity, unspecified: Secondary | ICD-10-CM | POA: Diagnosis not present

## 2017-11-22 DIAGNOSIS — N938 Other specified abnormal uterine and vaginal bleeding: Secondary | ICD-10-CM | POA: Diagnosis not present

## 2017-11-22 LAB — BAYER DCA HB A1C WAIVED: HB A1C: 4.6 % (ref <7.0)

## 2017-11-22 NOTE — Patient Instructions (Signed)
Great to see you!  I would recommend you follow up with GYN to discuss you r concerns. If we find iron deficiency then we will surely treat it.

## 2017-11-22 NOTE — Progress Notes (Signed)
   HPI  Patient presents today for abnormal periods.  Patient explains that she had a tubal in February, since then she has not had a normal period.  Patient explains that she previously had to take iron pills to have periods.  This happened again in March and she developed a heavy period.  She states that her periods are not normal after her tubal and she would like to understand.  She believes that she has low iron.  She requests checking an A1c as well.  PMH: Smoking status noted ROS: Per HPI  Objective: BP 125/90   Pulse 79   Temp (!) 97.3 F (36.3 C) (Oral)   Ht '5\' 5"'$  (1.651 m)   Wt 225 lb 12.8 oz (102.4 kg)   BMI 37.58 kg/m  Gen: NAD, alert, cooperative with exam HEENT: NCAT CV: RRR, good S1/S2, no murmur Resp: CTABL, no wheezes, non-labored Ext: No edema, warm Neuro: Alert and oriented, No gross deficits  Assessment and plan:  #Optional uterine bleeding Unclear etiology, patient has one abnormal period In the last 3 months after her tubal She believes this is due to low iron, we will check iron but I explained that it is unlikely solely due to low iron. On the other hand her heavy periods could be causing low iron TSH, CBC, CMP, ferritin Recommended follow-up with GYN  #Obesity Discussed-A1c   Orders Placed This Encounter  Procedures  . CBC with Differential/Platelet  . CMP14+EGFR  . Bayer DCA Hb A1c Waived  . TSH  . Ferritin     Laroy Apple, MD [facility] 11/22/2017, 9:07 AM

## 2017-11-23 LAB — CBC WITH DIFFERENTIAL/PLATELET
Basophils Absolute: 0 10*3/uL (ref 0.0–0.2)
Basos: 0 %
EOS (ABSOLUTE): 0.1 10*3/uL (ref 0.0–0.4)
EOS: 2 %
HEMATOCRIT: 40.3 % (ref 34.0–46.6)
Hemoglobin: 13.7 g/dL (ref 11.1–15.9)
IMMATURE GRANULOCYTES: 0 %
Immature Grans (Abs): 0 10*3/uL (ref 0.0–0.1)
LYMPHS ABS: 2.9 10*3/uL (ref 0.7–3.1)
Lymphs: 42 %
MCH: 30.3 pg (ref 26.6–33.0)
MCHC: 34 g/dL (ref 31.5–35.7)
MCV: 89 fL (ref 79–97)
MONOS ABS: 0.6 10*3/uL (ref 0.1–0.9)
Monocytes: 8 %
NEUTROS PCT: 48 %
Neutrophils Absolute: 3.4 10*3/uL (ref 1.4–7.0)
PLATELETS: 249 10*3/uL (ref 150–379)
RBC: 4.52 x10E6/uL (ref 3.77–5.28)
RDW: 13.1 % (ref 12.3–15.4)
WBC: 7.1 10*3/uL (ref 3.4–10.8)

## 2017-11-23 LAB — CMP14+EGFR
ALK PHOS: 88 IU/L (ref 39–117)
ALT: 18 IU/L (ref 0–32)
AST: 17 IU/L (ref 0–40)
Albumin/Globulin Ratio: 2.3 — ABNORMAL HIGH (ref 1.2–2.2)
Albumin: 4.5 g/dL (ref 3.5–5.5)
BUN / CREAT RATIO: 12 (ref 9–23)
BUN: 10 mg/dL (ref 6–20)
Bilirubin Total: 0.6 mg/dL (ref 0.0–1.2)
CALCIUM: 9.7 mg/dL (ref 8.7–10.2)
CO2: 23 mmol/L (ref 20–29)
CREATININE: 0.83 mg/dL (ref 0.57–1.00)
Chloride: 104 mmol/L (ref 96–106)
GFR calc Af Amer: 117 mL/min/{1.73_m2} (ref >59)
GFR calc non Af Amer: 101 mL/min/{1.73_m2} (ref >59)
Globulin, Total: 2 g/dL (ref 1.5–4.5)
Glucose: 85 mg/dL (ref 65–99)
Potassium: 4.1 mmol/L (ref 3.5–5.2)
Sodium: 140 mmol/L (ref 134–144)
Total Protein: 6.5 g/dL (ref 6.0–8.5)

## 2017-11-23 LAB — TSH: TSH: 3.02 u[IU]/mL (ref 0.450–4.500)

## 2017-11-23 LAB — FERRITIN: FERRITIN: 25 ng/mL (ref 15–150)

## 2017-11-25 NOTE — Telephone Encounter (Signed)
I called and spoke to pt and relayed that her carotid doppler study results were ok per Dr. Leta Baptist.  She verbalized understanding.

## 2018-01-05 ENCOUNTER — Encounter: Payer: Self-pay | Admitting: Obstetrics and Gynecology

## 2018-01-05 ENCOUNTER — Other Ambulatory Visit: Payer: Self-pay | Admitting: Obstetrics and Gynecology

## 2018-01-05 ENCOUNTER — Ambulatory Visit: Payer: Medicaid Other | Admitting: Obstetrics and Gynecology

## 2018-01-05 VITALS — BP 118/67 | HR 105 | Ht 65.0 in | Wt 230.2 lb

## 2018-01-05 DIAGNOSIS — Z3202 Encounter for pregnancy test, result negative: Secondary | ICD-10-CM

## 2018-01-05 DIAGNOSIS — N912 Amenorrhea, unspecified: Secondary | ICD-10-CM | POA: Diagnosis not present

## 2018-01-05 MED ORDER — MEDROXYPROGESTERONE ACETATE 10 MG PO TABS: 10.0000 mg | ORAL_TABLET | Freq: Every day | ORAL | 3 refills | Status: DC

## 2018-01-05 NOTE — Progress Notes (Addendum)
Patient ID: FIA HEBERT, female   DOB: 30-Oct-1995, 22 y.o.   MRN: [id number]   [facility] [facility] Visit  @DATE @            Patient name: Sonya Padilla MRN [id number]  Date of birth: January 31, 1996  CC & HPI:  VEARL AITKEN is a 22 y.o. female presenting today for worsening abdominal pain that started about a month ago. Associated symptoms include weight gain. She reports having two positive and two negative at home pregnancy tests. She hasn't had her period since 07/2017. She also had her tubes tied this year.. She denies fever, chills or any other symptoms or complaints.  ROS:  ROS +abdominal pain +weight gain  6 /lb/81month -fever -chills All systems are negative except as noted in the HPI and PMH.   Pertinent History Reviewed:   Reviewed: Significant for RLQ abdominal pain, tubal ligation Medical         Past Medical History:  Diagnosis Date  . Amenorrhea 10/02/2014  . Anxiety   . Broken leg    left  . Depression   . Headache   . Irregular periods 05/13/2015  . Nausea 03/13/2015  . Obesity   . Pelvic pain in female 05/13/2015  . Pregnant 05/13/2015  . RLQ abdominal pain 10/02/2014  . Suicide ([place])   . Urinary frequency 03/13/2015                              Surgical Hx:    Past Surgical History:  Procedure Laterality Date  . ESOPHAGOGASTRODUODENOSCOPY N/A 12/12/2014   NWG:[id number] reflux   . LAPAROSCOPIC TUBAL LIGATION Bilateral 08/24/2017   Procedure: LAPAROSCOPIC TUBAL STERILIZATION, FALLOPE RING APPLICATION;  Surgeon: Jonnie Kind, MD;  Location: [facility];  Service: Gynecology;  Laterality: Bilateral;  . WISDOM TOOTH EXTRACTION Bilateral    Medications: Reviewed & Updated - see associated section                       Current Outpatient Medications:  .  ferrous sulfate 325 (65 FE) MG tablet, Take 325 mg by mouth daily as needed., Disp: , Rfl:  .  topiramate (TOPAMAX) 50 MG tablet, Take 1 tablet (50 mg total) by mouth 2 (two) times daily., Disp: 180  tablet, Rfl: 4  Current Facility-Administered Medications:  .  gi cocktail (Maalox,Lidocaine,Donnatal), 30 mL, Oral, Once, Dettinger, Fransisca Kaufmann, MD   Social History: Reviewed -  reports that she quit smoking about 5 years ago. Her smoking use included cigarettes. She has a 1.00 pack-year smoking history. She has quit using smokeless tobacco. Her smokeless tobacco use included chew.  Objective Findings:  Vitals: not currently breastfeeding.  PHYSICAL EXAMINATION General appearance - alert, well appearing, and in no distress, oriented to person, place, and time and overweight Mental status - alert, oriented to person, place, and time, normal mood, behavior, speech, dress, motor activity, and thought processes, affect appropriate to mood, anxious  PELVIC External genitalia - clear discharge  [place] - fern positive  PROCEDURE multiple superficial foliculitis of the vulva in various stages of healing, 5 lanced with a sterile needle     Assessment & Plan:   A:  1. Provera 10mg  per day for 14 days to bring on period 2. Anovulation  3. Amenorrhea 4.  multiple superficial vulvar folliculitis sites.  P:  1. Serum HCG 2. If negative, Rx Provera 10mg  per day for  14 days 3    Test for MRSA in future. By signing my name below, I, Margit Banda, attest that this documentation has been prepared under the direction and in the presence of Jonnie Kind, MD. Electronically Signed: Margit Banda, Medical Scribe. 01/05/18. 3:03 PM.  I personally performed the services described in this documentation, which was SCRIBED in my presence. The recorded information has been reviewed and considered accurate. It has been edited as necessary during review. Jonnie Kind, MD

## 2018-01-06 LAB — BETA HCG QUANT (REF LAB)

## 2018-01-07 ENCOUNTER — Telehealth: Payer: Self-pay | Admitting: *Deleted

## 2018-01-07 NOTE — Telephone Encounter (Signed)
Pt called requesting hcg results. Pt informed. She had no other questions at this time.

## 2018-01-18 ENCOUNTER — Encounter: Payer: Self-pay | Admitting: Obstetrics and Gynecology

## 2018-01-18 ENCOUNTER — Telehealth: Payer: Self-pay | Admitting: Obstetrics and Gynecology

## 2018-01-18 NOTE — Telephone Encounter (Signed)
Left detailed message on VM that there is no mention of an u/s in Dr Johnnye Sima note at her last visit.  HCG ordered that was negative and Provera for 14 days to bring on period.  Informed I did see an CT was ordered back in February from GI doctor but no further images ordered.  Informed Dr Glo Herring was out of the office until tomorrow and I could discuss with him or she could also sent him a mychart message if she desired.

## 2018-01-18 NOTE — Telephone Encounter (Signed)
Pt is calling very demanding that she have and [place] scheduled at [facility], I advised her that she would have to talk to you or a nurse and have order before a nurse could schedule. Advised pt that we did not have any [place] appt in our office this week and she said to schedule at [facility]

## 2018-01-31 ENCOUNTER — Ambulatory Visit: Payer: Medicaid Other | Admitting: Pediatrics

## 2018-01-31 ENCOUNTER — Encounter: Payer: Self-pay | Admitting: Pediatrics

## 2018-01-31 VITALS — BP 114/73 | HR 87 | Temp 98.7°F | Ht 65.0 in | Wt 225.4 lb

## 2018-01-31 DIAGNOSIS — J069 Acute upper respiratory infection, unspecified: Secondary | ICD-10-CM

## 2018-01-31 DIAGNOSIS — J029 Acute pharyngitis, unspecified: Secondary | ICD-10-CM

## 2018-01-31 DIAGNOSIS — B372 Candidiasis of skin and nail: Secondary | ICD-10-CM

## 2018-01-31 DIAGNOSIS — R590 Localized enlarged lymph nodes: Secondary | ICD-10-CM | POA: Diagnosis not present

## 2018-01-31 LAB — RAPID STREP SCREEN (MED CTR MEBANE ONLY): Strep Gp A Ag, IA W/Reflex: NEGATIVE

## 2018-01-31 LAB — CULTURE, GROUP A STREP

## 2018-01-31 MED ORDER — FLUCONAZOLE 150 MG PO TABS: 150.0000 mg | ORAL_TABLET | ORAL | 0 refills | Status: DC

## 2018-01-31 MED ORDER — NYSTATIN 100000 UNIT/GM EX OINT: 1.0000 "application " | TOPICAL_OINTMENT | Freq: Two times a day (BID) | CUTANEOUS | 2 refills | Status: DC

## 2018-01-31 NOTE — Progress Notes (Signed)
  Subjective:   Patient ID: CALAYAH GUADARRAMA, female    DOB: Dec 08, 1995, 22 y.o.   MRN: [id number] CC: Sore Throat and Fever  HPI: VANIAH CHAMBERS is a 22 y.o. female   Sick for the last 4 days.  Her infant son has had similar symptoms.  She has had fevers as high as 102 at the beginning of illness.  Fever yesterday up to 100.  Has not been taking anything at home.  Her sore throat has been bothering her the most.  She has noticed one enlarged left side.  Tender to palpation.  Appetite slightly down.  She is a rash under both breasts that is been very itchy.  She has not used any prescription medicine on it.  Has not been improving with over-the-counter remedies.  Relevant past medical, surgical, family and social history reviewed. Allergies and medications reviewed and updated. Social History   Tobacco Use  Smoking Status Former Smoker  . Packs/day: 0.50  . Years: 2.00  . Pack years: 1.00  . Types: Cigarettes  . Last attempt to quit: 11/27/2012  . Years since quitting: 5.1  Smokeless Tobacco Former Systems developer  . Types: Chew  Tobacco Comment   quit in 04/2015   ROS: Per HPI   Objective:    BP 114/73   Pulse 87   Temp 98.7 F (37.1 C) (Oral)   Ht 5\' 5"  (1.651 m)   Wt 225 lb 6.4 oz (102.2 kg)   BMI 37.51 kg/m   Wt Readings from Last 3 Encounters:  01/31/18 225 lb 6.4 oz (102.2 kg)  01/05/18 230 lb 3.2 oz (104.4 kg)  11/22/17 225 lb 12.8 oz (102.4 kg)    Gen: NAD, alert, cooperative with exam, NCAT EYES: EOMI, no conjunctival injection, or no icterus ENT:  TMs dull pink with slightly splayed LR b/l, OP with mild erythema, white blister L tonsil LYMPH: 1cm L posterior LN, ttp, no redness CV: NRRR, normal S1/S2, no murmur, distal pulses 2+ b/l Resp: CTABL, no wheezes, normal WOB Abd: +BS, soft, NTND. Ext: No edema, warm Neuro: Alert and oriented MSK: normal muscle bulk Skin: under breasts b/l with red papules around several cm of pink plaque   Assessment & Plan:    Chrislyn was seen today for sore throat and fever.  Diagnoses and all orders for this visit: Acute URI Symptomatic care, return precautions discussed.  Sore throat Negative rapid test, we will follow-up culture. -     Culture, Group A Strep -     Culture, Group A Strep -     Rapid Strep Screen ([facility] & [facility] ONLY)  Candidal intertrigo -     fluconazole (DIFLUCAN) 150 MG tablet; Take 1 tablet (150 mg total) by mouth once a week. -     nystatin ointment (MYCOSTATIN); Apply 1 application topically 2 (two) times daily.  LAD (lymphadenopathy) of left cervical region One enlarged lymph node left side, suspect reactive with ongoing URI symptoms.  If not improving, any worsening let me know    Follow up plan: No follow-ups on file. Assunta Found, MD [place]

## 2018-02-03 ENCOUNTER — Encounter: Payer: Self-pay | Admitting: Pediatrics

## 2018-02-03 LAB — CULTURE, GROUP A STREP: Strep A Culture: NEGATIVE

## 2018-02-04 ENCOUNTER — Other Ambulatory Visit: Payer: Self-pay | Admitting: Pediatrics

## 2018-02-04 DIAGNOSIS — J329 Chronic sinusitis, unspecified: Secondary | ICD-10-CM

## 2018-02-04 MED ORDER — AMOXICILLIN-POT CLAVULANATE 875-125 MG PO TABS: 1.0000 | ORAL_TABLET | Freq: Two times a day (BID) | ORAL | 0 refills | Status: AC

## 2018-02-04 NOTE — Progress Notes (Signed)
Message left for patient that rx sent in.

## 2018-02-08 ENCOUNTER — Other Ambulatory Visit: Payer: Self-pay | Admitting: Obstetrics and Gynecology

## 2018-02-08 DIAGNOSIS — N912 Amenorrhea, unspecified: Secondary | ICD-10-CM

## 2018-02-08 DIAGNOSIS — R102 Pelvic and perineal pain: Secondary | ICD-10-CM

## 2018-02-09 ENCOUNTER — Ambulatory Visit: Payer: Medicaid Other | Admitting: Pediatrics

## 2018-02-09 ENCOUNTER — Encounter: Payer: Self-pay | Admitting: Obstetrics and Gynecology

## 2018-02-09 ENCOUNTER — Ambulatory Visit (INDEPENDENT_AMBULATORY_CARE_PROVIDER_SITE_OTHER): Payer: Medicaid Other

## 2018-02-09 ENCOUNTER — Encounter: Payer: Self-pay | Admitting: Pediatrics

## 2018-02-09 ENCOUNTER — Ambulatory Visit (INDEPENDENT_AMBULATORY_CARE_PROVIDER_SITE_OTHER): Payer: Medicaid Other | Admitting: Obstetrics and Gynecology

## 2018-02-09 VITALS — BP 108/63 | HR 83 | Ht 65.0 in | Wt 224.0 lb

## 2018-02-09 DIAGNOSIS — N912 Amenorrhea, unspecified: Secondary | ICD-10-CM | POA: Diagnosis not present

## 2018-02-09 DIAGNOSIS — R102 Pelvic and perineal pain: Secondary | ICD-10-CM

## 2018-02-09 NOTE — Telephone Encounter (Signed)
Talked to pt, son's head hit her nose earlier, now when she lies down has yellowish fluid leaking out. Recent URI symptoms. D/w pt, needs to be seen.

## 2018-02-09 NOTE — Progress Notes (Addendum)
Patient ID: Sonya Padilla, female   DOB: 01-22-1996, 22 y.o.   MRN: [id number]    [facility] Visit  '@DATE' @            Patient name: Sonya Padilla MRN [id number]  Date of birth: 1995-10-11  CC & HPI:  Sonya Padilla is a 22 y.o. female presenting today for pelvic pain for past 2-3 months. See last e-note from pt  .The medicine you gave me has yet to start a period. If anything I am having more pain on my left side. I need an ultrasound done. It is not a GI problem because I was cleared by my GI that i didn't need to come back. He made an CAT scan for my stomach in February because he thought I had a hernia but when the appointment can, I no longer had the knot so I cancelled it. This is something to do with my ovary or else I would have a period and i am very certain that i would like to know what is causing it. I have 2 kids, about to start school, working, and I do NOT have time for these games. I am tired of being in pain, it is starting to mess with my everyday activities, please help.      She describes a continous really sharp pain and nothing helps. Before it would help if she lid it down or laid on her left side but no longer helps. Her pain is worsened after she has a bowel movemnt. Bm's regular  ROS:  ROS +Amenorrhea +chronic pelvic pain -fever -chills  All systems are negative except as noted in the HPI and PMH.   Pertinent History Reviewed:   Reviewed: Significant for none Medical         Past Medical History:  Diagnosis Date  . Amenorrhea 10/02/2014  . Anxiety   . Broken leg    left  . Depression   . Headache   . Irregular periods 05/13/2015  . Nausea 03/13/2015  . Obesity   . Pelvic pain in female 05/13/2015  . Pregnant 05/13/2015  . RLQ abdominal pain 10/02/2014  . Suicide ([place])   . Urinary frequency 03/13/2015                              Surgical Hx:    Past Surgical History:  Procedure Laterality Date  . ESOPHAGOGASTRODUODENOSCOPY  N/A 12/12/2014   YOV:ZCHYIFOYDX reflux   . LAPAROSCOPIC TUBAL LIGATION Bilateral 08/24/2017   Procedure: LAPAROSCOPIC TUBAL STERILIZATION, FALLOPE RING APPLICATION;  Surgeon: Jonnie Kind, MD;  Location: [facility];  Service: Gynecology;  Laterality: Bilateral;  . WISDOM TOOTH EXTRACTION Bilateral    Medications: Reviewed & Updated - see associated section                       Current Outpatient Medications:  .  amoxicillin-clavulanate (AUGMENTIN) 875-125 MG tablet, Take 1 tablet by mouth 2 (two) times daily for 7 days., Disp: 14 tablet, Rfl: 0 .  ferrous sulfate 325 (65 FE) MG tablet, Take 325 mg by mouth daily as needed., Disp: , Rfl:  .  fluconazole (DIFLUCAN) 150 MG tablet, Take 1 tablet (150 mg total) by mouth once a week., Disp: 4 tablet, Rfl: 0 .  medroxyPROGESTERone (PROVERA) 10 MG tablet, Take 1 tablet (10 mg total) by mouth daily. One tablet daily x 2 weeks. Repeat as  directed, Disp: 14 tablet, Rfl: 3 .  nystatin ointment (MYCOSTATIN), Apply 1 application topically 2 (two) times daily., Disp: 30 g, Rfl: 2 .  topiramate (TOPAMAX) 50 MG tablet, Take 1 tablet (50 mg total) by mouth 2 (two) times daily., Disp: 180 tablet, Rfl: 4  Current Facility-Administered Medications:  .  gi cocktail (Maalox,Lidocaine,Donnatal), 30 mL, Oral, Once, Dettinger, Fransisca Kaufmann, MD   Social History: Reviewed -  reports that she quit smoking about 5 years ago. Her smoking use included cigarettes. She has a 1.00 pack-year smoking history. She has quit using smokeless tobacco. Her smokeless tobacco use included chew.  Objective Findings:  Vitals: There were no vitals taken for this visit.  PHYSICAL EXAMINATION General appearance - alert, well appearing, and in no distress and oriented to person, place, and time Mental status - alert, oriented to person, place, and time, normal mood, behavior, speech, dress, motor activity, and thought processes, affect appropriate to mood  PELVIC Vagina - muscle tone normal,  vaginal sidewall particularly left perceived as quite uncomfortable by patient even though muscle tone is normal, relaxed, left side wall more tender than right side. Cervix - cervical mucous clear, slightly milky, tenderness upon examination.  Uterus - mild tenderness upon examination. Bladder pain not as bad as uterus    Assessment & Plan:   A:  1.  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 transvaginal u/s done this PM, essentially unremarkable 2. CBC and ESR GC /CHL 3. F/u 1 week or by phone after labs..  Consider Neurontin or other vulvodynia pain regimens  By signing my name below, I, [name], attest that this documentation has been prepared under the direction and in the presence of Jonnie Kind, MD. Electronically Signed: [name] Medical Scribe. 02/09/18. 1:50 PM.  I personally performed the services described in this documentation, which was SCRIBED in my presence. The recorded information has been reviewed and considered accurate. It has been edited as necessary during review. Jonnie Kind, MD

## 2018-02-09 NOTE — Progress Notes (Signed)
PELVIC US TA/TV: homogeneous anteverted uterus,wnl,normal ovaries bilat,EEC 7.6 mm,small amount of simple cul de sac fluid,ovaries appear mobile,left adnexal pain during ultrasound

## 2018-02-10 ENCOUNTER — Other Ambulatory Visit: Payer: Self-pay | Admitting: Obstetrics and Gynecology

## 2018-02-10 LAB — CBC WITH DIFFERENTIAL/PLATELET
Basophils Absolute: 0 10*3/uL (ref 0.0–0.2)
Basos: 0 %
EOS (ABSOLUTE): 0.1 10*3/uL (ref 0.0–0.4)
EOS: 1 %
HEMATOCRIT: 39.5 % (ref 34.0–46.6)
HEMOGLOBIN: 13.5 g/dL (ref 11.1–15.9)
Immature Grans (Abs): 0 10*3/uL (ref 0.0–0.1)
Immature Granulocytes: 0 %
LYMPHS ABS: 2.5 10*3/uL (ref 0.7–3.1)
Lymphs: 29 %
MCH: 29.7 pg (ref 26.6–33.0)
MCHC: 34.2 g/dL (ref 31.5–35.7)
MCV: 87 fL (ref 79–97)
MONOCYTES: 7 %
Monocytes Absolute: 0.6 10*3/uL (ref 0.1–0.9)
NEUTROS ABS: 5.4 10*3/uL (ref 1.4–7.0)
Neutrophils: 63 %
Platelets: 294 10*3/uL (ref 150–450)
RBC: 4.55 x10E6/uL (ref 3.77–5.28)
RDW: 12.7 % (ref 12.3–15.4)
WBC: 8.6 10*3/uL (ref 3.4–10.8)

## 2018-02-10 LAB — SEDIMENTATION RATE: Sed Rate: 33 mm/hr — ABNORMAL HIGH (ref 0–32)

## 2018-02-10 MED ORDER — DOXYCYCLINE HYCLATE 100 MG PO CAPS: 100.0000 mg | ORAL_CAPSULE | Freq: Two times a day (BID) | ORAL | 0 refills | Status: DC

## 2018-02-10 NOTE — Progress Notes (Signed)
Mildly elevated ESR in pt with choronic pelvic pain.  Will Rx doxy cycline x 7d .

## 2018-02-11 ENCOUNTER — Telehealth: Payer: Self-pay | Admitting: Obstetrics and Gynecology

## 2018-02-11 LAB — GC/CHLAMYDIA PROBE AMP
Chlamydia trachomatis, NAA: NEGATIVE
NEISSERIA GONORRHOEAE BY PCR: NEGATIVE

## 2018-02-11 NOTE — Telephone Encounter (Signed)
Pt aware of Doxycycline Rx , will take x 1 wk and report back for f/u on symptoms. ESR was moderately elevated at 33.

## 2018-02-13 ENCOUNTER — Encounter: Payer: Self-pay | Admitting: Pediatrics

## 2018-02-15 ENCOUNTER — Encounter: Payer: Self-pay | Admitting: Family Medicine

## 2018-02-16 ENCOUNTER — Encounter: Payer: Self-pay | Admitting: Obstetrics and Gynecology

## 2018-02-16 ENCOUNTER — Ambulatory Visit: Payer: Medicaid Other | Admitting: Obstetrics and Gynecology

## 2018-02-16 VITALS — BP 127/51 | HR 72 | Ht 65.0 in | Wt 224.0 lb

## 2018-02-16 DIAGNOSIS — R102 Pelvic and perineal pain: Secondary | ICD-10-CM

## 2018-02-16 NOTE — Progress Notes (Signed)
Patient ID: Sonya Padilla, female   DOB: 12-24-1995, 22 y.o.   MRN: [id number]    [facility] [facility] Visit  @DATE @            Patient name: Sonya Padilla MRN [id number]  Date of birth: 1995/10/12  CC & HPI:  Sonya Padilla is a 22 y.o. female presenting today follow up to U/S U/S showed normal ovaries and tissues Sonya Padilla is having no more pain after taking antibiotics which were given empirically due to mildly elevated sed rate and pelvic discomfort on ultrasound and bimanual ROS:  ROS  NOT DONE  Pertinent History Reviewed:   Reviewed: Significant for NONE Medical         Past Medical History:  Diagnosis Date  . Amenorrhea 10/02/2014  . Anxiety   . Broken leg    left  . Depression   . Headache   . Irregular periods 05/13/2015  . Nausea 03/13/2015  . Obesity   . Pelvic pain in female 05/13/2015  . Pregnant 05/13/2015  . RLQ abdominal pain 10/02/2014  . Suicide ([place])   . Urinary frequency 03/13/2015                              Surgical Hx:    Past Surgical History:  Procedure Laterality Date  . ESOPHAGOGASTRODUODENOSCOPY N/A 12/12/2014   QIO:NGEXBMWUXL reflux   . LAPAROSCOPIC TUBAL LIGATION Bilateral 08/24/2017   Procedure: LAPAROSCOPIC TUBAL STERILIZATION, FALLOPE RING APPLICATION;  Surgeon: Jonnie Kind, MD;  Location: [facility];  Service: Gynecology;  Laterality: Bilateral;  . WISDOM TOOTH EXTRACTION Bilateral    Medications: Reviewed & Updated - see associated section                       Current Outpatient Medications:  .  doxycycline (VIBRAMYCIN) 100 MG capsule, Take 1 capsule (100 mg total) by mouth 2 (two) times daily., Disp: 14 capsule, Rfl: 0 .  ferrous sulfate 325 (65 FE) MG tablet, Take 325 mg by mouth daily as needed., Disp: , Rfl:  .  medroxyPROGESTERone (PROVERA) 10 MG tablet, Take 1 tablet (10 mg total) by mouth daily. One tablet daily x 2 weeks. Repeat as directed, Disp: 14 tablet, Rfl: 3 .  topiramate (TOPAMAX) 50 MG tablet, Take 1  tablet (50 mg total) by mouth 2 (two) times daily., Disp: 180 tablet, Rfl: 4 .  fluconazole (DIFLUCAN) 150 MG tablet, Take 1 tablet (150 mg total) by mouth once a week. (Patient not taking: Reported on 02/09/2018), Disp: 4 tablet, Rfl: 0 .  nystatin ointment (MYCOSTATIN), Apply 1 application topically 2 (two) times daily. (Patient not taking: Reported on 02/09/2018), Disp: 30 g, Rfl: 2  Current Facility-Administered Medications:  .  gi cocktail (Maalox,Lidocaine,Donnatal), 30 mL, Oral, Once, Dettinger, Fransisca Kaufmann, MD   Social History: Reviewed -  reports that she quit smoking about 5 years ago. Her smoking use included cigarettes. She has a 1.00 pack-year smoking history. She has quit using smokeless tobacco. Her smokeless tobacco use included chew.  Objective Findings:  Vitals: Blood pressure (!) 127/51, pulse 72, height 5\' 5"  (1.651 m), weight 224 lb (101.6 kg).  PHYSICAL EXAMINATION General appearance - alert, well appearing, and in no distress and oriented to person, place, and time Mental status - alert, oriented to person, place, and time, normal mood, behavior, speech, dress, motor activity, and thought processes, affect appropriate to mood  PELVIC NOT  DONE    Assessment & Plan:   A:  1.  Resolved pelvic pain  P:  1.  PRN    By signing my name below, I, [name], attest that this documentation has been prepared under the direction and in the presence of Jonnie Kind, MD. Electronically Signed: [place]. 02/16/18. 1:42 PM.  I personally performed the services described in this documentation, which was SCRIBED in my presence. The recorded information has been reviewed and considered accurate. It has been edited as necessary during review. Jonnie Kind, MD

## 2018-06-22 ENCOUNTER — Ambulatory Visit: Payer: Medicaid Other | Admitting: Adult Health

## 2018-06-23 ENCOUNTER — Encounter: Payer: Self-pay | Admitting: Adult Health

## 2018-09-23 DIAGNOSIS — H1045 Other chronic allergic conjunctivitis: Secondary | ICD-10-CM | POA: Diagnosis not present

## 2018-09-23 DIAGNOSIS — H40033 Anatomical narrow angle, bilateral: Secondary | ICD-10-CM | POA: Diagnosis not present

## 2018-11-02 ENCOUNTER — Telehealth: Payer: Self-pay | Admitting: *Deleted

## 2018-11-02 NOTE — Telephone Encounter (Signed)
Spoke with patient and updated EMR prior to video visit next week. She  verbalized understanding, appreciation.

## 2018-11-07 ENCOUNTER — Ambulatory Visit: Payer: Medicaid Other | Admitting: Diagnostic Neuroimaging

## 2018-11-07 ENCOUNTER — Other Ambulatory Visit: Payer: Self-pay

## 2018-11-07 ENCOUNTER — Telehealth: Payer: Self-pay | Admitting: Diagnostic Neuroimaging

## 2018-11-07 NOTE — Telephone Encounter (Signed)
Pt called stating that she did not get an email for her webex call for today. She was told to check her spam and junk mail. She said it was not there. When the pt called in it was already passed her appt time. Please advise.

## 2018-11-07 NOTE — Telephone Encounter (Signed)
Spoke with patient and advised her I had replied to her my chart messages. She had not read them so I reviewed what I had written. She asked to be reschedule to May 6th which was her original date, so we did. I advised her the e mail will be changed with current date/time and sent back to her.  I advised she call for any questions, issues and if she cannot connect through app, I advised she close app and join meeting through green box in e mail. I confirmed her e mail, and she verbalized understanding, appreciation.

## 2018-11-16 ENCOUNTER — Other Ambulatory Visit: Payer: Self-pay

## 2018-11-16 ENCOUNTER — Ambulatory Visit (INDEPENDENT_AMBULATORY_CARE_PROVIDER_SITE_OTHER): Payer: Medicaid Other | Admitting: Diagnostic Neuroimaging

## 2018-11-16 DIAGNOSIS — R51 Headache: Secondary | ICD-10-CM | POA: Diagnosis not present

## 2018-11-16 DIAGNOSIS — R519 Headache, unspecified: Secondary | ICD-10-CM

## 2018-11-16 DIAGNOSIS — G43809 Other migraine, not intractable, without status migrainosus: Secondary | ICD-10-CM | POA: Diagnosis not present

## 2018-11-16 MED ORDER — TOPIRAMATE 50 MG PO TABS: 50.0000 mg | ORAL_TABLET | Freq: Two times a day (BID) | ORAL | 4 refills | Status: DC

## 2018-11-16 NOTE — Progress Notes (Signed)
    Virtual Visit via Video Note  I connected with Sonya Padilla on 11/16/18 at  2:30 PM EDT by a video enabled telemedicine application and verified that I am speaking with the correct person using two identifiers.   I discussed the limitations of evaluation and management by telemedicine and the availability of in person appointments. The patient expressed understanding and agreed to proceed.  Patient is at their home. I am at the office.    History of Present Illness:  - HA are continuing; almost on daily basis; she related to being back in school [name]) and having be online more with computer screen - no more transient vision loss    Observations/Objective:  VIDEO EXAM  GENERAL EXAM/CONSTITUTIONAL:  Vitals: There were no vitals filed for this visit.  There is no height or weight on file to calculate BMI. Wt Readings from Last 3 Encounters:  02/16/18 224 lb (101.6 kg)  02/09/18 224 lb (101.6 kg)  01/31/18 225 lb 6.4 oz (102.2 kg)     Patient is in no distress; well developed, nourished and groomed; neck is supple   NEUROLOGIC: MENTAL STATUS:  No flowsheet data found.  awake, alert, oriented to person, place and time  recent and remote memory intact  normal attention and concentration  language fluent, comprehension intact, naming intact  fund of knowledge appropriate  CRANIAL NERVE:   2nd, 3rd, 4th, 6th - visual fields full to confrontation, extraocular muscles intact, no nystagmus  5th - facial sensation symmetric  7th - facial strength symmetric  8th - hearing intact  11th - shoulder shrug symmetric  12th - tongue protrusion midline  MOTOR:   NO TREMOR; NO DRIFT IN BUE  SENSORY:   normal and symmetric to light touch  COORDINATION:   fine finger movements normal  05/19/17 LP - Technically satisfactory lumbar puncture. Normal opening pressure of 12. Clear colorless CSF.  10/27/17 carotid u/s - no significant stenosis    Assessment and Plan:  MIGRAINE WITH AURA - continue topiramate 50mg  twice a day; drink plenty of water   Meds ordered this encounter  Medications  . topiramate (TOPAMAX) 50 MG tablet    Sig: Take 1 tablet (50 mg total) by mouth 2 (two) times daily.    Dispense:  180 tablet    Refill:  4     Follow Up Instructions:  - Return in about 4 months (around 03/19/2019) for with NP (Amy Lomax).    I discussed the assessment and treatment plan with the patient. The patient was provided an opportunity to ask questions and all were answered. The patient agreed with the plan and demonstrated an understanding of the instructions.   The patient was advised to call back or seek an in-person evaluation if the symptoms worsen or if the condition fails to improve as anticipated.  I provided 15 minutes of non-face-to-face time during this encounter.   Penni Bombard, MD 03/14/1193, 1:74 PM Certified in Neurology, Neurophysiology and Neuroimaging  [facility] [address] [place], [place] [address] [phone]

## 2018-11-23 ENCOUNTER — Ambulatory Visit: Payer: Medicaid Other | Admitting: Adult Health

## 2019-06-23 ENCOUNTER — Telehealth: Payer: Self-pay | Admitting: Diagnostic Neuroimaging

## 2019-06-23 NOTE — Telephone Encounter (Signed)
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

## 2019-06-26 NOTE — Telephone Encounter (Signed)
LVM advising the patient that Dr Leta Baptist had wanted her to follow up this fall with his N.  I advised her Sonya Lomax NP has 2 openings on this Wed, 7:30 and 10 am, and I will put hold on them. Requested she call back asap to let us know if she can come for one of those, left #.

## 2019-06-28 NOTE — Telephone Encounter (Addendum)
Called patient to offer follow up with NP next Monday. She stated that she can't come due to child having appointment. I offered to work her in Dr [employer] schedule next week and she asked for an appointment the last week of Jan. NP's schedule was full, so I scheduled her with Dr Leta Baptist. I asked how she is doing. She stated that she moved and stopped taking Topamax x 3 weeks. She has been back on it x 1 month. She stated she is averaging 2 migraines a day, may last up to 6 hours. She stated that it may take 1 hour for her eyesight to normalize. I advised will let Dr Leta Baptist know and call her with any recommendations. She  verbalized understanding, appreciation.

## 2019-08-07 ENCOUNTER — Ambulatory Visit: Payer: Medicaid Other | Admitting: Diagnostic Neuroimaging

## 2019-09-04 ENCOUNTER — Encounter: Payer: Self-pay | Admitting: Diagnostic Neuroimaging

## 2019-09-04 ENCOUNTER — Other Ambulatory Visit: Payer: Self-pay

## 2019-09-04 ENCOUNTER — Ambulatory Visit (INDEPENDENT_AMBULATORY_CARE_PROVIDER_SITE_OTHER): Payer: Medicaid Other | Admitting: Diagnostic Neuroimaging

## 2019-09-04 VITALS — BP 126/67 | HR 80 | Temp 97.4°F | Ht 65.0 in | Wt 225.0 lb

## 2019-09-04 DIAGNOSIS — G43109 Migraine with aura, not intractable, without status migrainosus: Secondary | ICD-10-CM

## 2019-09-04 MED ORDER — RIZATRIPTAN BENZOATE 10 MG PO TBDP: 10.0000 mg | ORAL_TABLET | ORAL | 11 refills | Status: DC | PRN

## 2019-09-04 MED ORDER — TOPIRAMATE 100 MG PO TABS: 100.0000 mg | ORAL_TABLET | Freq: Two times a day (BID) | ORAL | 12 refills | Status: DC

## 2019-09-04 NOTE — Progress Notes (Signed)
Chief Complaint  Patient presents with  . Migraine    rm 6, "have a migraine every day, not taking any OTC, I massage my head, may last 30 min to 8 hours"      History of Present Illness:  UPDATE (09/04/19, VRP): Since last visit, HA continue on daily basis. Symptoms are moderate-severe. No alleviating factors. Light and sound aggravate HA. Tolerating topiramate 50mg  twice a day. In school (business degree), working, and having trouble sleeping at night.  UPDATE (11/16/18, VRP):  - HA are continuing; almost on daily basis; she related to being back in school [employer] [employer]) and having be online more with computer screen - no more transient vision loss  UPDATE (09/15/17, VRP): Since last visit, doing well. Tolerating TPX. No alleviating or aggravating factors. HA and vision changes are improved. LP done and had normal opening pressure (12cm H2O). Carotid u/s not done since last visit.  PRIOR HPI (04/21/17): 24 year old right-handed female here for evaluation of transient visual loss. For past 4 months patient has had intermittent episodes of left eye vision loss, lasting 15-30 minutes at a time, typically in the evening. This is usually followed by a headache on the left side. These attacks occurred for 3 months in a row. For the past 1 month she has not had any attacks. Patient had an eye exam which was unremarkable. She saw PCP who ordered MRI of the brain. 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  No prior history of headaches. No family history of migraine. Patient has baseline weight of 200, went up to 260 pounds during pregnancy, and now weighs 230 pounds.    Observations/Objective:  GENERAL EXAM/CONSTITUTIONAL: Vitals:  Vitals:   09/04/19 1513  BP: 126/67  Pulse: 80  Temp: (!) 97.4 F (36.3 C)  Weight: 225 lb (102.1 kg)  Height: 5\' 5"  (1.651 m)   Body mass index is 37.44 kg/m. Wt Readings from  Last 3 Encounters:  09/04/19 225 lb (102.1 kg)  02/16/18 224 lb (101.6 kg)  02/09/18 224 lb (101.6 kg)    Patient is in no distress; well developed, nourished and groomed; neck is supple   NEUROLOGIC: MENTAL STATUS:  No flowsheet data found.  awake, alert, oriented to person, place and time  recent and remote memory intact  normal attention and concentration  language fluent, comprehension intact, naming intact  fund of knowledge appropriate  CRANIAL NERVE:   2nd, 3rd, 4th, 6th - visual fields full to confrontation, extraocular muscles intact, no nystagmus  5th - facial sensation symmetric  7th - facial strength symmetric  8th - hearing intact  11th - shoulder shrug symmetric  12th - tongue protrusion midline  MOTOR:   NO TREMOR; NO DRIFT IN BUE  SENSORY:   normal and symmetric to light touch  COORDINATION:   fine finger movements normal  05/19/17 LP - Technically satisfactory lumbar puncture. Normal opening pressure of 12. Clear colorless CSF.  10/27/17 carotid u/s - no significant stenosis   Assessment and Plan:  24 y.o. female with migraine with aura.   MIGRAINE WITH AURA - MIGRAINE PREVENTION --> increase topiramate to 100mg  twice a day; drink plenty of water  - MIGRAINE RESCUE --> rizatriptan 10mg  as needed for breakthrough headache; may repeat x 1 after 2 hours; max 2 tabs per day or 8 per month  Meds ordered this encounter  Medications  . rizatriptan (MAXALT-MLT) 10  MG disintegrating tablet    Sig: Take 1 tablet (10 mg total) by mouth as needed for migraine. May repeat in 2 hours if needed    Dispense:  9 tablet    Refill:  11  . topiramate (TOPAMAX) 100 MG tablet    Sig: Take 1 tablet (100 mg total) by mouth 2 (two) times daily.    Dispense:  60 tablet    Refill:  12    Follow Up Instructions:  - Return in about 6 months (around 03/03/2020) for with NP (Amy Lomax).     Penni Bombard, MD [id number], [id number] PM Certified in  Neurology, Neurophysiology and Neuroimaging  [facility] [address], [place], [place] [address] [phone]

## 2019-09-04 NOTE — Patient Instructions (Signed)
  MIGRAINE WITH AURA - MIGRAINE PREVENTION --> increase topiramate to 100mg  twice a day; drink plenty of water  - MIGRAINE RESCUE --> rizatriptan 10mg  as needed for breakthrough headache; may repeat x 1 after 2 hours; max 2 tabs per day or 8 per month

## 2020-02-01 ENCOUNTER — Other Ambulatory Visit: Payer: Self-pay | Admitting: Obstetrics and Gynecology

## 2020-02-01 ENCOUNTER — Encounter: Payer: Self-pay | Admitting: Obstetrics and Gynecology

## 2020-02-01 ENCOUNTER — Ambulatory Visit (INDEPENDENT_AMBULATORY_CARE_PROVIDER_SITE_OTHER): Payer: Medicaid Other | Admitting: Obstetrics and Gynecology

## 2020-02-01 VITALS — BP 126/74 | HR 100 | Ht 65.0 in | Wt 224.8 lb

## 2020-02-01 DIAGNOSIS — Z3202 Encounter for pregnancy test, result negative: Secondary | ICD-10-CM | POA: Diagnosis not present

## 2020-02-01 DIAGNOSIS — R1032 Left lower quadrant pain: Secondary | ICD-10-CM | POA: Diagnosis not present

## 2020-02-01 LAB — POCT URINE PREGNANCY: Preg Test, Ur: NEGATIVE

## 2020-02-01 NOTE — Progress Notes (Signed)
Patient ID: Sonya Padilla, female   DOB: April 03, 1996, 24 y.o.   MRN: [id number]    [facility] Visit  @DATE @            Patient name: Sonya Padilla MRN [id number]  Date of birth: 1995/10/21  CC & HPI: Lower abdominal pain since her LMP in May  Sonya Padilla is a 24 y.o. female presenting today for positive at home urine pregnancy tests on 07/12, 07/13, 07/14, and 07/17 and a negative test on 07/19. Her urine pregnancy test in the office today was negative. Her blood type is O-. She also reports severe left sided abdominal pain that radiates to her knee. Nothing makes the pain better and it has been getting worse since her last period in May. The patient denies vaginal bleeding, fever, chills or any other symptoms or complaints at this time.  She is convinced that the pregnancy tests at home are accurate and positive.  Tests here today are negative for pregnancy ROS:  ROS  + left sided pelvic pain denies fever chills - fever - chills All systems are negative except as noted in the HPI and PMH.   Pertinent History Reviewed:   Reviewed: Medical         Past Medical History:  Diagnosis Date  . Amenorrhea 10/02/2014  . Anxiety   . Broken leg    left  . Depression   . Headache   . Irregular periods 05/13/2015  . Nausea 03/13/2015  . Obesity   . Pelvic pain in female 05/13/2015  . Pregnant 05/13/2015  . RLQ abdominal pain 10/02/2014  . Suicide ([place])   . Urinary frequency 03/13/2015                              Surgical Hx:    Past Surgical History:  Procedure Laterality Date  . ESOPHAGOGASTRODUODENOSCOPY N/A 12/12/2014   YYQ:MGNOIBBCWU reflux   . LAPAROSCOPIC TUBAL LIGATION Bilateral 08/24/2017   Procedure: LAPAROSCOPIC TUBAL STERILIZATION, FALLOPE RING APPLICATION;  Surgeon: Jonnie Kind, MD;  Location: [facility];  Service: Gynecology;  Laterality: Bilateral;  . WISDOM TOOTH EXTRACTION Bilateral    Medications: Reviewed & Updated - see associated section                        Current Outpatient Medications:  .  ferrous sulfate 325 (65 FE) MG tablet, Take 325 mg by mouth daily as needed., Disp: , Rfl:  .  rizatriptan (MAXALT-MLT) 10 MG disintegrating tablet, Take 1 tablet (10 mg total) by mouth as needed for migraine. May repeat in 2 hours if needed, Disp: 9 tablet, Rfl: 11 .  topiramate (TOPAMAX) 100 MG tablet, Take 1 tablet (100 mg total) by mouth 2 (two) times daily., Disp: 60 tablet, Rfl: 12  Current Facility-Administered Medications:  .  gi cocktail (Maalox,Lidocaine,Donnatal), 30 mL, Oral, Once, Dettinger, Fransisca Kaufmann, MD   Social History: Reviewed -  reports that she quit smoking about 7 years ago. Her smoking use included cigarettes. She has a 1.00 pack-year smoking history. She has quit using smokeless tobacco.  Her smokeless tobacco use included chew.  Objective Findings:  Vitals: Blood pressure 126/74, pulse 100, height 5\' 5"  (1.651 m), weight (!) 224 lb 12.8 oz (102 kg), last menstrual period 12/02/2019.  PHYSICAL EXAMINATION General appearance - alert, well appearing, and in no distress, oriented to person, place, and time and overweight  Mental status - alert, oriented to person, place, and time, normal mood, behavior, speech, dress, motor activity, and thought processes, affect appropriate to mood  PELVIC Vague tenderness throughout. No lateralizing pain on bimanual.   Assessment & Plan:   A:  1.  Pelvic Pain chronic since LMP 2. 4 positive UPTs at home, negative UPT today 3. Amenorrhea x2 months  P:  1. HCG quant today, consider [place] tomorrow may require scheduling at [facility]  By signing my name below, I, De Burrs, attest that this documentation has been prepared under the direction and in the presence of Jonnie Kind, MD. Electronically Signed: De Burrs, Medical Scribe. 02/01/20. 4:46 PM.  I personally performed the services described in this documentation, which was SCRIBED in my presence. The  recorded information has been reviewed and considered accurate. It has been edited as necessary during review. Jonnie Kind, MD

## 2020-02-02 ENCOUNTER — Telehealth: Payer: Self-pay | Admitting: Obstetrics and Gynecology

## 2020-02-02 LAB — BETA HCG QUANT (REF LAB): hCG Quant: <1 m[IU]/mL

## 2020-02-02 NOTE — Telephone Encounter (Signed)
Pt aware quant results are not back yet. It will probably be later this afternoon when results come in. Pt was advised to keep a check on her MyChart. Pt voiced understanding. [place]

## 2020-02-02 NOTE — Telephone Encounter (Signed)
Patient would like someone to review lab results from yesterday.

## 2020-02-03 DIAGNOSIS — R102 Pelvic and perineal pain: Secondary | ICD-10-CM | POA: Diagnosis not present

## 2020-02-03 DIAGNOSIS — Z886 Allergy status to analgesic agent status: Secondary | ICD-10-CM | POA: Diagnosis not present

## 2020-02-03 DIAGNOSIS — Z79899 Other long term (current) drug therapy: Secondary | ICD-10-CM | POA: Diagnosis not present

## 2020-02-03 DIAGNOSIS — N83202 Unspecified ovarian cyst, left side: Secondary | ICD-10-CM | POA: Diagnosis not present

## 2020-03-04 ENCOUNTER — Ambulatory Visit (INDEPENDENT_AMBULATORY_CARE_PROVIDER_SITE_OTHER): Payer: Medicaid Other | Admitting: Family Medicine

## 2020-03-04 ENCOUNTER — Other Ambulatory Visit: Payer: Self-pay

## 2020-03-04 ENCOUNTER — Encounter: Payer: Self-pay | Admitting: Family Medicine

## 2020-03-04 VITALS — BP 111/67 | HR 96 | Ht 65.0 in | Wt 229.0 lb

## 2020-03-04 DIAGNOSIS — G43109 Migraine with aura, not intractable, without status migrainosus: Secondary | ICD-10-CM

## 2020-03-04 MED ORDER — RIZATRIPTAN BENZOATE 10 MG PO TBDP: 10.0000 mg | ORAL_TABLET | ORAL | 11 refills | Status: DC | PRN

## 2020-03-04 MED ORDER — TOPIRAMATE 100 MG PO TABS: 100.0000 mg | ORAL_TABLET | Freq: Two times a day (BID) | ORAL | 12 refills | Status: DC

## 2020-03-04 NOTE — Progress Notes (Signed)
PATIENT: Sonya Padilla DOB: 1996/04/15  REASON FOR VISIT: follow up HISTORY FROM: patient  Chief Complaint  Patient presents with  . Follow-up    6 month f/u for migraines. States her migraines were better but since she started a new job with loud noises, her headaches are back.   . room 2    alone      HISTORY OF PRESENT ILLNESS: Today 03/04/20 Sonya Padilla is a 24 y.o. female here today for follow up for migraines. Topiramate was increased to 100mg  BID and rizatriptan started for abortive therapy.  Headaches are fairly well managed.  She does note some worsening after starting a new job.  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  She reports that headaches are not as frequent or severe.  She is using rizatriptan about 2-3 times per month.  She continue to study business administration.  She is hoping to graduate next year.   HISTORY: (copied from Dr Gladstone Lighter note on 09/04/2019)  UPDATE (09/04/19, VRP): Since last visit, HA continue on daily basis. Symptoms are moderate-severe. No alleviating factors. Light and sound aggravate HA. Tolerating topiramate 50mg  twice a day. In school (business degree), working, and having trouble sleeping at night.  UPDATE (11/16/18, VRP):  - HA are continuing; almost on daily basis; she related to being back in school [employer]) and having be online more with computer screen - no more transient vision loss  UPDATE (09/15/17, VRP): Since last visit, doingwell. ToleratingTPX. No alleviating or aggravating factors.HA and vision changes are improved. LP done and had normal opening pressure (12cm H2O). Carotid u/s not done since last visit.  PRIOR HPI (04/21/17):24 year old right-handed female here for evaluation of transient visual loss. For past 4 months patient has had intermittent episodes of left eye vision loss, lasting 15-30 minutes at a time, typically in the evening.  This is usually followed by a headache on the left side. These attacks occurred for 3 months in a row. For the past 1 month she has not had any attacks. Patient had an eye exam which was unremarkable. She saw PCP who ordered MRI of the brain. 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  No prior history of headaches. No family history of migraine. Patient has baseline weight of 200, went up to 260 pounds during pregnancy, and now weighs 230 pounds.    REVIEW OF SYSTEMS: Out of a complete 14 system review of symptoms, the patient complains only of the following symptoms, headaches and all other reviewed systems are negative.  ALLERGIES: Allergies  Allergen Reactions  . Tylenol [Acetaminophen] Other (See Comments)    Migraine    HOME MEDICATIONS: Outpatient Medications Prior to Visit  Medication Sig Dispense Refill  . ferrous sulfate 325 (65 FE) MG tablet Take 325 mg by mouth daily as needed.    . rizatriptan (MAXALT-MLT) 10 MG disintegrating tablet Take 1 tablet (10 mg total) by mouth as needed for migraine. May repeat in 2 hours if needed 9 tablet 11  . topiramate (TOPAMAX) 100 MG tablet Take 1 tablet (100 mg total) by mouth 2 (two) times daily. 60 tablet 12   Facility-Administered Medications Prior to Visit  Medication Dose Route Frequency Provider Last Rate Last Admin  . gi cocktail (Maalox,Lidocaine,Donnatal)  30 mL Oral Once Dettinger, Fransisca Kaufmann, MD  PAST MEDICAL HISTORY: Past Medical History:  Diagnosis Date  . Amenorrhea 10/02/2014  . Anxiety   . Broken leg    left  . Depression   . Headache   . Irregular periods 05/13/2015  . Nausea 03/13/2015  . Obesity   . Pelvic pain in female 05/13/2015  . Pregnant 05/13/2015  . RLQ abdominal pain 10/02/2014  . Suicide ([place])   . Urinary frequency 03/13/2015    PAST SURGICAL HISTORY: Past Surgical History:  Procedure Laterality Date  .  ESOPHAGOGASTRODUODENOSCOPY N/A 12/12/2014   INO:MVEHMCNOBS reflux   . LAPAROSCOPIC TUBAL LIGATION Bilateral 08/24/2017   Procedure: LAPAROSCOPIC TUBAL STERILIZATION, FALLOPE RING APPLICATION;  Surgeon: Jonnie Kind, MD;  Location: [facility];  Service: Gynecology;  Laterality: Bilateral;  . WISDOM TOOTH EXTRACTION Bilateral     FAMILY HISTORY: Family History  Problem Relation Age of Onset  . Hypertension Mother   . Stroke Paternal Aunt   . Diabetes Maternal Grandmother   . Heart disease Maternal Grandmother   . Hyperlipidemia Maternal Grandmother   . Drug abuse Maternal Grandmother   . Glaucoma Maternal Grandmother   . Cataracts Maternal Grandmother   . Cancer Paternal Grandfather   . Colon cancer Paternal Grandfather   . Psoriasis Sister   . Crohn's disease Sister   . Prostate cancer Maternal Grandfather   . Alcohol abuse Maternal Grandfather   . Other Father        heart issues  . Heart attack Father   . Cervical cancer Maternal Aunt   . Physical abuse Maternal Uncle   . Obesity Other   . Bipolar disorder Other        paternal aunt and cousin  . Drug abuse Other     SOCIAL HISTORY: Social History   Socioeconomic History  . Marital status: Single    Spouse name: Not on file  . Number of children: 2  . Years of education: GED  . Highest education level: Not on file  Occupational History    Comment: NA  Tobacco Use  . Smoking status: Former Smoker    Packs/day: 0.50    Years: 2.00    Pack years: 1.00    Types: Cigarettes    Quit date: 11/27/2012    Years since quitting: 7.2  . Smokeless tobacco: Former Systems developer    Types: Chew  . Tobacco comment: quit in 04/2015  Vaping Use  . Vaping Use: Never used  Substance and Sexual Activity  . Alcohol use: No    Alcohol/week: 0.0 standard drinks  . Drug use: No  . Sexual activity: Yes    Partners: Male    Birth control/protection: Surgical    Comment: tubal  Other Topics Concern  . Not on file  Social History Narrative    Lives with sig other, son, daughter   Caffeine- coffee, 4- 16 oz daily   Social Determinants of Health   Financial Resource Strain:   . Difficulty of Paying Living Expenses: Not on file  Food Insecurity:   . Worried About Charity fundraiser in the Last Year: Not on file  . Ran Out of Food in the Last Year: Not on file  Transportation Needs:   . Lack of Transportation (Medical): Not on file  . Lack of Transportation (Non-Medical): Not on file  Physical Activity:   . Days of Exercise per Week: Not on file  . Minutes of Exercise per Session: Not on file  Stress:   . Feeling of Stress :  Not on file  Social Connections:   . Frequency of Communication with Friends and Family: Not on file  . Frequency of Social Gatherings with Friends and Family: Not on file  . Attends Religious Services: Not on file  . Active Member of Clubs or Organizations: Not on file  . Attends Archivist Meetings: Not on file  . Marital Status: Not on file  Intimate Partner Violence:   . Fear of Current or Ex-Partner: Not on file  . Emotionally Abused: Not on file  . Physically Abused: Not on file  . Sexually Abused: Not on file      PHYSICAL EXAM  Vitals:   03/04/20 1508  BP: 111/67  Pulse: 96  Weight: 229 lb (103.9 kg)  Height: 5\' 5"  (1.651 m)   Body mass index is 38.11 kg/m.  Generalized: Well developed, in no acute distress  Cardiology: normal rate and rhythm, no murmur noted Respiratory: clear to auscultation bilaterally  Neurological examination  Mentation: Alert oriented to time, place, history taking. Follows all commands speech and language fluent Cranial nerve II-XII: Pupils were equal round reactive to light. Extraocular movements were full, visual field were full  Motor: The motor testing reveals 5 over 5 strength of all 4 extremities. Good symmetric motor tone is noted throughout.  Gait and station: Gait is normal.   DIAGNOSTIC DATA (LABS, IMAGING, TESTING) - I  reviewed patient records, labs, notes, testing and imaging myself where available.  No flowsheet data found.   Lab Results  Component Value Date   WBC 8.6 02/09/2018   HGB 13.5 02/09/2018   HCT 39.5 02/09/2018   MCV 87 02/09/2018   PLT 294 02/09/2018      Component Value Date/Time   NA 140 11/22/2017 0906   K 4.1 11/22/2017 0906   CL 104 11/22/2017 0906   CO2 23 11/22/2017 0906   GLUCOSE 85 11/22/2017 0906   GLUCOSE 83 08/18/2017 1547   BUN 10 11/22/2017 0906   CREATININE 0.83 11/22/2017 0906   CREATININE 0.70 08/18/2017 1547   CALCIUM 9.7 11/22/2017 0906   PROT 6.5 11/22/2017 0906   ALBUMIN 4.5 11/22/2017 0906   AST 17 11/22/2017 0906   ALT 18 11/22/2017 0906   ALKPHOS 88 11/22/2017 0906   BILITOT 0.6 11/22/2017 0906   GFRNONAA 101 11/22/2017 0906   GFRAA 117 11/22/2017 0906   Lab Results  Component Value Date   CHOL 126 08/03/2016   HDL 42 08/03/2016   LDLCALC 72 08/03/2016   TRIG 62 08/03/2016   CHOLHDL 3.0 08/03/2016   Lab Results  Component Value Date   HGBA1C 4.6 11/22/2017   No results found for: [id number] Lab Results  Component Value Date   TSH 3.020 11/22/2017       ASSESSMENT AND PLAN 24 y.o. year old female  has a past medical history of Amenorrhea (10/02/2014), Anxiety, Broken leg, Depression, Headache, Irregular periods (05/13/2015), Nausea (03/13/2015), Obesity, Pelvic pain in female (05/13/2015), Pregnant (05/13/2015), RLQ abdominal pain (10/02/2014), Suicide ([place]), and Urinary frequency (03/13/2015). here with     ICD-10-CM   1. Migraine with aura and without status migrainosus, not intractable  G43.109     Deiona is doing fairly well.  She does note some worsening of her headaches since working nights and being around loud noises.  Overall, she feels that headaches are well managed.  We will continue topiramate 100 mg twice daily and rizatriptan as needed.  We have discussed adding additional preventative medications but she wishes  to hold  off at this time.  We will continue to monitor for worsening of headaches.  She was encouraged to work on healthy lifestyle habits.  She will work on increasing water intake to 40 to 50 ounces of water daily.  She will ensure adequate sleep.  She will follow-up with primary care as directed.  She will return to see me in 1 year, sooner if needed.  She verbalizes understanding and agreement with this plan.   No orders of the defined types were placed in this encounter.    Meds ordered this encounter  Medications  . topiramate (TOPAMAX) 100 MG tablet    Sig: Take 1 tablet (100 mg total) by mouth 2 (two) times daily.    Dispense:  60 tablet    Refill:  12    Order Specific Question:   Supervising Provider    Answer:   Melvenia Beam [id number]  . rizatriptan (MAXALT-MLT) 10 MG disintegrating tablet    Sig: Take 1 tablet (10 mg total) by mouth as needed for migraine. May repeat in 2 hours if needed    Dispense:  9 tablet    Refill:  11    Order Specific Question:   Supervising Provider    Answer:   Melvenia Beam [id number]      I spent 15 minutes with the patient. 50% of this time was spent counseling and educating patient on plan of care and medications.    Debbora Presto, FNP-C 03/04/2020, 4:29 PM [facility] [address], [place], [place] [address] [phone]

## 2020-03-04 NOTE — Patient Instructions (Addendum)
Continue topiramate 100mg  BID. Continue rizatriptan for abortive therapy.   Work on healthy lifestyle habits. Get plenty of sleep. Drink 40-50 ounces of water daily. Try to exercise daily.   Follow up in 1 year   Migraine Headache A migraine headache is a very strong throbbing pain on one side or both sides of your head. This type of headache can also cause other symptoms. It can last from 4 hours to 3 days. Talk with your doctor about what things may bring on (trigger) this condition. What are the causes? The exact cause of this condition is not known. This condition may be triggered or caused by:  Drinking alcohol.  Smoking.  Taking medicines, such as: ? Medicine used to treat chest pain (nitroglycerin). ? Birth control pills. ? Estrogen. ? Some blood pressure medicines.  Eating or drinking certain products.  Doing physical activity. Other things that may trigger a migraine headache include:  Having a menstrual period.  Pregnancy.  Hunger.  Stress.  Not getting enough sleep or getting too much sleep.  Weather changes.  Tiredness (fatigue). What increases the risk?  Being 59-55 years old.  Being female.  Having a family history of migraine headaches.  Being Caucasian.  Having depression or anxiety.  Being very overweight. What are the signs or symptoms?  A throbbing pain. This pain may: ? Happen in any area of the head, such as on one side or both sides. ? Make it hard to do daily activities. ? Get worse with physical activity. ? Get worse around bright lights or loud noises.  Other symptoms may include: ? Feeling sick to your stomach (nauseous). ? Vomiting. ? Dizziness. ? Being sensitive to bright lights, loud noises, or smells.  Before you get a migraine headache, you may get warning signs (an aura). An aura may include: ? Seeing flashing lights or having blind spots. ? Seeing bright spots, halos, or zigzag lines. ? Having tunnel vision or  blurred vision. ? Having numbness or a tingling feeling. ? Having trouble talking. ? Having weak muscles.  Some people have symptoms after a migraine headache (postdromal phase), such as: ? Tiredness. ? Trouble thinking (concentrating). How is this treated?  Taking medicines that: ? Relieve pain. ? Relieve the feeling of being sick to your stomach. ? Prevent migraine headaches.  Treatment may also include: ? Having acupuncture. ? Avoiding foods that bring on migraine headaches. ? Learning ways to control your body functions (biofeedback). ? Therapy to help you know and deal with negative thoughts (cognitive behavioral therapy). Follow these instructions at home: Medicines  Take over-the-counter and prescription medicines only as told by your doctor.  Ask your doctor if the medicine prescribed to you: ? Requires you to avoid driving or using heavy machinery. ? Can cause trouble pooping (constipation). You may need to take these steps to prevent or treat trouble pooping:  Drink enough fluid to keep your pee (urine) pale yellow.  Take over-the-counter or prescription medicines.  Eat foods that are high in fiber. These include beans, whole grains, and fresh fruits and vegetables.  Limit foods that are high in fat and sugar. These include fried or sweet foods. Lifestyle  Do not drink alcohol.  Do not use any products that contain nicotine or tobacco, such as cigarettes, e-cigarettes, and chewing tobacco. If you need help quitting, ask your doctor.  Get at least 8 hours of sleep every night.  Limit and deal with stress. General instructions      Keep  a journal to find out what may bring on your migraine headaches. For example, write down: ? What you eat and drink. ? How much sleep you get. ? Any change in what you eat or drink. ? Any change in your medicines.  If you have a migraine headache: ? Avoid things that make your symptoms worse, such as bright lights. ? It  may help to lie down in a dark, quiet room. ? Do not drive or use heavy machinery. ? Ask your doctor what activities are safe for you.  Keep all follow-up visits as told by your doctor. This is important. Contact a doctor if:  You get a migraine headache that is different or worse than others you have had.  You have more than 15 headache days in one month. Get help right away if:  Your migraine headache gets very bad.  Your migraine headache lasts longer than 72 hours.  You have a fever.  You have a stiff neck.  You have trouble seeing.  Your muscles feel weak or like you cannot control them.  You start to lose your balance a lot.  You start to have trouble walking.  You pass out (faint).  You have a seizure. Summary  A migraine headache is a very strong throbbing pain on one side or both sides of your head. These headaches can also cause other symptoms.  This condition may be treated with medicines and changes to your lifestyle.  Keep a journal to find out what may bring on your migraine headaches.  Contact a doctor if you get a migraine headache that is different or worse than others you have had.  Contact your doctor if you have more than 15 headache days in a month. This information is not intended to replace advice given to you by your health care provider. Make sure you discuss any questions you have with your health care provider. Document Revised: 10/21/2018 Document Reviewed: 08/11/2018 Elsevier Patient Education  [place].

## 2020-03-07 NOTE — Progress Notes (Signed)
I reviewed note and agree with plan.   Penni Bombard, MD 02/12/2335, 1:22 PM Certified in Neurology, Neurophysiology and Neuroimaging  [facility] [address], [place], [place] [address] [phone]

## 2020-03-17 DIAGNOSIS — Z5321 Procedure and treatment not carried out due to patient leaving prior to being seen by health care provider: Secondary | ICD-10-CM | POA: Diagnosis not present

## 2020-03-17 DIAGNOSIS — R079 Chest pain, unspecified: Secondary | ICD-10-CM | POA: Diagnosis not present

## 2020-03-17 DIAGNOSIS — R200 Anesthesia of skin: Secondary | ICD-10-CM | POA: Diagnosis not present

## 2020-08-05 ENCOUNTER — Telehealth: Payer: Self-pay | Admitting: Family Medicine

## 2020-08-05 NOTE — Telephone Encounter (Signed)
Migraines

## 2020-08-05 NOTE — Telephone Encounter (Signed)
..   Pt understands that although there may be some limitations with this type of visit, we will take all precautions to reduce any security or privacy concerns.  Pt understands that this will be treated like an in office visit and we will file with pt's insurance, and there may be a patient responsible charge related to this service. ? ?

## 2020-08-06 ENCOUNTER — Encounter: Payer: Self-pay | Admitting: Family Medicine

## 2020-08-06 ENCOUNTER — Telehealth (INDEPENDENT_AMBULATORY_CARE_PROVIDER_SITE_OTHER): Payer: Medicaid Other | Admitting: Family Medicine

## 2020-08-06 DIAGNOSIS — G43109 Migraine with aura, not intractable, without status migrainosus: Secondary | ICD-10-CM

## 2020-08-06 DIAGNOSIS — G43809 Other migraine, not intractable, without status migrainosus: Secondary | ICD-10-CM | POA: Diagnosis not present

## 2020-08-06 MED ORDER — SUMATRIPTAN SUCCINATE 100 MG PO TABS: 100.0000 mg | ORAL_TABLET | Freq: Once | ORAL | 2 refills | Status: DC | PRN

## 2020-08-06 NOTE — Progress Notes (Signed)
PATIENT: Sonya Padilla DOB: 01-Feb-1996  REASON FOR VISIT: follow up HISTORY FROM: patient  Virtual Visit via Telephone Note  I connected with Sonya Padilla on 08/06/20 at 10:30 AM EST by telephone and verified that I am speaking with the correct person using two identifiers.   I discussed the limitations, risks, security and privacy concerns of performing an evaluation and management service by telephone and the availability of in person appointments. I also discussed with the patient that there may be a patient responsible charge related to this service. The patient expressed understanding and agreed to proceed.   History of Present Illness:  08/06/20 Sonya Padilla is a 25 y.o. female here today for follow up for migraines. Last seen 02/2020 and doing fairly well on topiramate 100mg  BID and rizatriptan PRN. She feels that headaches have been fairly well managed. She had a headache over Thanksgiving that lasted about a week. She has had a migraine for the past 3-4 days. It is on the right side. It is described as throbbing, pounding, sharp and stabbing, all at the same time. She has taken rizatriptan multiple times and has not had any relief. She admits that she does not drink much water. Maybe 12 ounces a day. She works third shift. She does not have consistent sleep schedule. She has not seen PCP recently. History of iron deficiency.  She has missed multiple days of work this week due to current migraine.   History (copied from my previous note)  Today 03/04/20 Sonya Padilla is a 25 y.o. female here today for follow up for migraines. Topiramate was increased to 100mg  BID and rizatriptan started for abortive therapy.  Headaches are fairly well managed.  She does note some worsening after starting a new job.  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  She reports that headaches are not as frequent or severe.  She is  using rizatriptan about 2-3 times per month.  She continue to study business administration.  She is hoping to graduate next year.   Observations/Objective:  Generalized: Well developed, in no acute distress  Mentation: Alert oriented to time, place, history taking. Follows all commands speech and language fluent   Assessment and Plan:  25 y.o. year old female  has a past medical history of Amenorrhea (10/02/2014), Anxiety, Broken leg, Depression, Headache, Irregular periods (05/13/2015), Nausea (03/13/2015), Obesity, Pelvic pain in female (05/13/2015), Pregnant (05/13/2015), RLQ abdominal pain (10/02/2014), Suicide ([place]), and Urinary frequency (03/13/2015). here with    ICD-10-CM   1. Migraine with aura and without status migrainosus, not intractable  G43.109   2. Migraine variant  G43.809      Signe has had 2 isolated events where migrainous symptoms persisted for 3 to 4 days.  Migraines are not responsive to rizatriptan.  We will switch abortive medication to sumatriptan 100 mg.  She was educated on appropriate administration and possible side effects of this medication.  We will continue topiramate 100 mg twice daily.  We have discussed most common triggers for migraines.  I have encouraged her to increase her water intake.  I have also encouraged her to work on sleep schedules.  Follow-up with primary care recommended.  May consider updating blood work due to history of iron deficiency.  Management of stress encouraged.  She will call me with any worsening symptoms or if sumatriptan is ineffective.  She will return for follow-up in 6 months, sooner  if needed.  She verbalizes understanding and agreement with this plan.  No orders of the defined types were placed in this encounter.   Meds ordered this encounter  Medications  . SUMAtriptan (IMITREX) 100 MG tablet    Sig: Take 1 tablet (100 mg total) by mouth once as needed for up to 1 dose for migraine. May repeat in 2 hours if headache  persists or recurs.    Dispense:  10 tablet    Refill:  2    Order Specific Question:   Supervising Provider    Answer:   Melvenia Beam [id number]     Follow Up Instructions:  I discussed the assessment and treatment plan with the patient. The patient was provided an opportunity to ask questions and all were answered. The patient agreed with the plan and demonstrated an understanding of the instructions.   The patient was advised to call back or seek an in-person evaluation if the symptoms worsen or if the condition fails to improve as anticipated.  I provided 15 minutes of non-face-to-face time during this encounter.  Patient is located at her place of residence during my chart visit.  Providers in the office.   Debbora Presto, NP

## 2020-08-06 NOTE — Patient Instructions (Signed)
Below is our plan:  We will continue topiramate 100mg  BID. Stop rizatriptan. Start sumatriptan for abortive therapy. Please take 1 tablet at onset of headache. May take 1 additional tablet in 2 hours if needed. Do not take more than 2 tablets in 24 hours or more than 10 in a month.    Please make sure you are staying well hydrated. I recommend 50-60 ounces daily. Well balanced diet and regular exercise encouraged. Manage stress levels.    Please continue follow up with care team as directed. Consider updating labwork.    Follow up with me in 6 months, sooner if needed   You may receive a survey regarding today's visit. I encourage you to leave honest feed back as I do use this information to improve patient care. Thank you for seeing me today!      Sumatriptan tablets What is this medicine? SUMATRIPTAN (soo ma TRIP tan) is used to treat migraines with or without aura. An aura is a strange feeling or visual disturbance that warns you of an attack. It is not used to prevent migraines. This medicine may be used for other purposes; ask your health care provider or pharmacist if you have questions. COMMON BRAND NAME(S): Imitrex, Migraine Pack What should I tell my health care provider before I take this medicine? They need to know if you have any of these conditions:  cigarette smoker  circulation problems in fingers and toes  diabetes  heart disease  high blood pressure  high cholesterol  history of irregular heartbeat  history of stroke  kidney disease  liver disease  stomach or intestine problems  an unusual or allergic reaction to sumatriptan, other medicines, foods, dyes, or preservatives  pregnant or trying to get pregnant  breast-feeding How should I use this medicine? Take this medicine by mouth with a glass of water. Follow the directions on the prescription label. Do not take it more often than directed. Talk to your pediatrician regarding the use of  this medicine in children. Special care may be needed. Overdosage: If you think you have taken too much of this medicine contact a poison control center or emergency room at once. NOTE: This medicine is only for you. Do not share this medicine with others. What if I miss a dose? This does not apply. This medicine is not for regular use. What may interact with this medicine? Do not take this medicine with any of the following medicines:  certain medicines for migraine headache like almotriptan, eletriptan, frovatriptan, naratriptan, rizatriptan, sumatriptan, zolmitriptan  ergot alkaloids like dihydroergotamine, ergonovine, ergotamine, methylergonovine  MAOIs like Carbex, Eldepryl, Marplan, Nardil, and Parnate This medicine may also interact with the following medications:  certain medicines for depression, anxiety, or psychotic disorders This list may not describe all possible interactions. Give your health care provider a list of all the medicines, herbs, non-prescription drugs, or dietary supplements you use. Also tell them if you smoke, drink alcohol, or use illegal drugs. Some items may interact with your medicine. What should I watch for while using this medicine? Visit your healthcare professional for regular checks on your progress. Tell your healthcare professional if your symptoms do not start to get better or if they get worse. You may get drowsy or dizzy. Do not drive, use machinery, or do anything that needs mental alertness until you know how this medicine affects you. Do not stand up or sit up quickly, especially if you are an older patient. This reduces the risk of  dizzy or fainting spells. Alcohol may interfere with the effect of this medicine. Tell your healthcare professional right away if you have any change in your eyesight. If you take migraine medicines for 10 or more days a month, your migraines may get worse. Keep a diary of headache days and medicine use. Contact your  healthcare professional if your migraine attacks occur more frequently. What side effects may I notice from receiving this medicine? Side effects that you should report to your doctor or health care professional as soon as possible:  allergic reactions like skin rash, itching or hives, swelling of the face, lips, or tongue  changes in vision  chest pain or chest tightness  signs and symptoms of a dangerous change in heartbeat or heart rhythm like chest pain; dizziness; fast, irregular heartbeat; palpitations; feeling faint or lightheaded; falls; breathing problems  signs and symptoms of a stroke like changes in vision; confusion; trouble speaking or understanding; severe headaches; sudden numbness or weakness of the face, arm or leg; trouble walking; dizziness; loss of balance or coordination  signs and symptoms of serotonin syndrome like irritable; confusion; diarrhea; fast or irregular heartbeat; muscle twitching; stiff muscles; trouble walking; sweating; high fever; seizures; chills; vomiting Side effects that usually do not require medical attention (report to your doctor or health care professional if they continue or are bothersome):  diarrhea  dizziness  drowsiness  dry mouth  headache  nausea, vomiting  pain, tingling, numbness in the hands or feet  stomach pain This list may not describe all possible side effects. Call your doctor for medical advice about side effects. You may report side effects to FDA at 1-800-FDA-1088. Where should I keep my medicine? Keep out of the reach of children. Store at room temperature between 2 and 30 degrees C (36 and 86 degrees F). Throw away any unused medicine after the expiration date. NOTE: This sheet is a summary. It may not cover all possible information. If you have questions about this medicine, talk to your doctor, pharmacist, or health care provider.  2021 Elsevier/Gold Standard (2018-01-11 15:05:37)   Migraine Headache A  migraine headache is a very strong throbbing pain on one side or both sides of your head. This type of headache can also cause other symptoms. It can last from 4 hours to 3 days. Talk with your doctor about what things may bring on (trigger) this condition. What are the causes? The exact cause of this condition is not known. This condition may be triggered or caused by:  Drinking alcohol.  Smoking.  Taking medicines, such as: ? Medicine used to treat chest pain (nitroglycerin). ? Birth control pills. ? Estrogen. ? Some blood pressure medicines.  Eating or drinking certain products.  Doing physical activity. Other things that may trigger a migraine headache include:  Having a menstrual period.  Pregnancy.  Hunger.  Stress.  Not getting enough sleep or getting too much sleep.  Weather changes.  Tiredness (fatigue). What increases the risk?  Being 40-45 years old.  Being female.  Having a family history of migraine headaches.  Being Caucasian.  Having depression or anxiety.  Being very overweight. What are the signs or symptoms?  A throbbing pain. This pain may: ? Happen in any area of the head, such as on one side or both sides. ? Make it hard to do daily activities. ? Get worse with physical activity. ? Get worse around bright lights or loud noises.  Other symptoms may include: ? Feeling sick to  your stomach (nauseous). ? Vomiting. ? Dizziness. ? Being sensitive to bright lights, loud noises, or smells.  Before you get a migraine headache, you may get warning signs (an aura). An aura may include: ? Seeing flashing lights or having blind spots. ? Seeing bright spots, halos, or zigzag lines. ? Having tunnel vision or blurred vision. ? Having numbness or a tingling feeling. ? Having trouble talking. ? Having weak muscles.  Some people have symptoms after a migraine headache (postdromal phase), such as: ? Tiredness. ? Trouble thinking  (concentrating). How is this treated?  Taking medicines that: ? Relieve pain. ? Relieve the feeling of being sick to your stomach. ? Prevent migraine headaches.  Treatment may also include: ? Having acupuncture. ? Avoiding foods that bring on migraine headaches. ? Learning ways to control your body functions (biofeedback). ? Therapy to help you know and deal with negative thoughts (cognitive behavioral therapy). Follow these instructions at home: Medicines  Take over-the-counter and prescription medicines only as told by your doctor.  Ask your doctor if the medicine prescribed to you: ? Requires you to avoid driving or using heavy machinery. ? Can cause trouble pooping (constipation). You may need to take these steps to prevent or treat trouble pooping:  Drink enough fluid to keep your pee (urine) pale yellow.  Take over-the-counter or prescription medicines.  Eat foods that are high in fiber. These include beans, whole grains, and fresh fruits and vegetables.  Limit foods that are high in fat and sugar. These include fried or sweet foods. Lifestyle  Do not drink alcohol.  Do not use any products that contain nicotine or tobacco, such as cigarettes, e-cigarettes, and chewing tobacco. If you need help quitting, ask your doctor.  Get at least 8 hours of sleep every night.  Limit and deal with stress. General instructions  Keep a journal to find out what may bring on your migraine headaches. For example, write down: ? What you eat and drink. ? How much sleep you get. ? Any change in what you eat or drink. ? Any change in your medicines.  If you have a migraine headache: ? Avoid things that make your symptoms worse, such as bright lights. ? It may help to lie down in a dark, quiet room. ? Do not drive or use heavy machinery. ? Ask your doctor what activities are safe for you.  Keep all follow-up visits as told by your doctor. This is important.      Contact a doctor  if:  You get a migraine headache that is different or worse than others you have had.  You have more than 15 headache days in one month. Get help right away if:  Your migraine headache gets very bad.  Your migraine headache lasts longer than 72 hours.  You have a fever.  You have a stiff neck.  You have trouble seeing.  Your muscles feel weak or like you cannot control them.  You start to lose your balance a lot.  You start to have trouble walking.  You pass out (faint).  You have a seizure. Summary  A migraine headache is a very strong throbbing pain on one side or both sides of your head. These headaches can also cause other symptoms.  This condition may be treated with medicines and changes to your lifestyle.  Keep a journal to find out what may bring on your migraine headaches.  Contact a doctor if you get a migraine headache that is different  or worse than others you have had.  Contact your doctor if you have more than 15 headache days in a month. This information is not intended to replace advice given to you by your health care provider. Make sure you discuss any questions you have with your health care provider. Document Revised: 10/21/2018 Document Reviewed: 08/11/2018 Elsevier Patient Education  [employer].

## 2020-08-19 NOTE — Progress Notes (Signed)
I reviewed note and agree with plan.   Penni Bombard, MD 08/16/2681, 4:19 PM Certified in Neurology, Neurophysiology and Neuroimaging  [facility] [address] [place],  [address] [phone]

## 2021-02-17 ENCOUNTER — Telehealth: Payer: Self-pay | Admitting: Family Medicine

## 2021-02-17 NOTE — Telephone Encounter (Signed)
Pt called, permanently changing pharmacy for medication refills to  [facility] [place], [place]  Phone: [phone]

## 2021-02-17 NOTE — Telephone Encounter (Signed)
Pharmacy updated.

## 2021-03-11 ENCOUNTER — Other Ambulatory Visit: Payer: Self-pay

## 2021-03-11 MED ORDER — TOPIRAMATE 100 MG PO TABS: 100.0000 mg | ORAL_TABLET | Freq: Two times a day (BID) | ORAL | 0 refills | Status: DC

## 2021-10-03 ENCOUNTER — Other Ambulatory Visit: Payer: Self-pay

## 2021-10-03 ENCOUNTER — Ambulatory Visit (INDEPENDENT_AMBULATORY_CARE_PROVIDER_SITE_OTHER): Payer: BLUE CROSS/BLUE SHIELD

## 2021-10-03 ENCOUNTER — Ambulatory Visit
Admission: EM | Admit: 2021-10-03 | Discharge: 2021-10-03 | Disposition: A | Discharge status: Home / Self Care | Payer: BLUE CROSS/BLUE SHIELD | Attending: Student | Admitting: Student

## 2021-10-03 ENCOUNTER — Encounter: Payer: Self-pay | Admitting: Emergency Medicine

## 2021-10-03 DIAGNOSIS — T148XXA Other injury of unspecified body region, initial encounter: Secondary | ICD-10-CM | POA: Diagnosis not present

## 2021-10-03 DIAGNOSIS — Z8709 Personal history of other diseases of the respiratory system: Secondary | ICD-10-CM

## 2021-10-03 DIAGNOSIS — R0782 Intercostal pain: Secondary | ICD-10-CM | POA: Diagnosis not present

## 2021-10-03 DIAGNOSIS — F17210 Nicotine dependence, cigarettes, uncomplicated: Secondary | ICD-10-CM

## 2021-10-03 MED ORDER — TIZANIDINE HCL 2 MG PO CAPS: 2.0000 mg | ORAL_CAPSULE | Freq: Three times a day (TID) | ORAL | 0 refills | Status: DC

## 2021-10-03 MED ORDER — TIZANIDINE HCL 2 MG PO TABS: 2.0000 mg | ORAL_TABLET | Freq: Three times a day (TID) | ORAL | 0 refills | Status: AC | PRN

## 2021-10-03 NOTE — ED Triage Notes (Signed)
Right sided rib pain x 2 weeks.  States she has been coughing.   ?

## 2021-10-03 NOTE — Discharge Instructions (Addendum)
-  Start the muscle relaxer-Zanaflex (tizanidine), up to 3 times daily for muscle spasms and pain.  This can make you drowsy, so take at bedtime or when you do not need to drive or operate machinery. ?-Ice pack ?-You can take Tylenol up to 1000 mg 3 times daily, and ibuprofen up to 600 mg 3 times daily with food.  You can take these together, or alternate every 3-4 hours. ?-The pain seems to be localized over your ribs, but I cannot completely rule out an abdominal issue like gallbladder disease.  If your symptoms get worse instead of better with treatment, hfead to the emergency department for further imaging.  Also head to the emergency department if symptoms get worse including shortness of breath or any chest pain at rest. ?

## 2021-10-03 NOTE — ED Provider Notes (Signed)
[facility]    CSN: [id number] Arrival date & time: 10/03/21  2440      History   Chief Complaint No chief complaint on file.   HPI Sonya Padilla is a 26 y.o. female presenting with concern for muscular strain.  History cigarette smoker.  Describes 2 weeks of nonproductive cough, not associated with shortness of breath, dizziness, chest pain, fever/chills.  States that from this, she has developed some pain immediately below her right breast, and she is concerned this could be a rib fracture or something constricting her lungs.  Denies shortness of breath at rest, but states that it hurts to take a deep breath.  She does have a history of asthma, but has not required inhalers for this for 8 years. Denies abd pain, change in bowel/bladder function. No prior history abd procedures. LMP 09/30/21, States she is not pregnant or breastfeeding.   HPI  Past Medical History:  Diagnosis Date   Amenorrhea 10/02/2014   Anxiety    Broken leg    left   Depression    Headache    Irregular periods 05/13/2015   Nausea 03/13/2015   Obesity    Pelvic pain in female 05/13/2015   Pregnant 05/13/2015   RLQ abdominal pain 10/02/2014   Suicide (HCC)    Urinary frequency 03/13/2015    Patient Active Problem List   Diagnosis Date Noted   Abdominal pain 08/18/2017   Encounter for counseling regarding contraception 06/23/2017   IBS (irritable bowel syndrome) 12/23/2016   Depression, recurrent (HCC) 08/26/2016   Nexplanon in place 03/19/2016   Encounter for sterilization 03/04/2016   History of postpartum hemorrhage 02/26/2016   Ulcerative esophagitis 01/30/2015   Gastroesophageal reflux disease with esophagitis    Amenorrhea 10/02/2014   RLQ abdominal pain 10/02/2014   Migraines w/ aura 07/18/2014   Obesity (BMI 30-39.9) 10/24/2013   Smoker 10/24/2013   Marijuana use 10/24/2013   Major depression w/ hx of suicidal thoughts 08/06/2011   Post traumatic stress disorder 08/06/2011     Past Surgical History:  Procedure Laterality Date   ESOPHAGOGASTRODUODENOSCOPY N/A 12/12/2014   [id number] reflux    LAPAROSCOPIC TUBAL LIGATION Bilateral 08/24/2017   Procedure: LAPAROSCOPIC TUBAL STERILIZATION, FALLOPE RING APPLICATION;  Surgeon: Tilda Burrow, MD;  Location: [facility];  Service: Gynecology;  Laterality: Bilateral;   WISDOM TOOTH EXTRACTION Bilateral     OB History     Gravida  2   Para  2   Term  2   Preterm      AB      Living  2      SAB      IAB      Ectopic      Multiple  0   Live Births  2            Home Medications    Prior to Admission medications   Medication Sig Start Date End Date Taking? Authorizing Provider  tizanidine (ZANAFLEX) 2 MG capsule Take 1 capsule (2 mg total) by mouth 3 (three) times daily. 10/03/21  Yes Rhys Martini, PA-C  ferrous sulfate 325 (65 FE) MG tablet Take 325 mg by mouth daily as needed.    [provider]    Family History Family History  Problem Relation Age of Onset   Hypertension Mother    Stroke Paternal Aunt    Diabetes Maternal Grandmother    Heart disease Maternal Grandmother    Hyperlipidemia Maternal Grandmother  Drug abuse Maternal Grandmother    Glaucoma Maternal Grandmother    Cataracts Maternal Grandmother    Cancer Paternal Grandfather    Colon cancer Paternal Grandfather    Psoriasis Sister    Crohn's disease Sister    Prostate cancer Maternal Grandfather    Alcohol abuse Maternal Grandfather    Other Father        heart issues   Heart attack Father    Cervical cancer Maternal Aunt    Physical abuse Maternal Uncle    Obesity Other    Bipolar disorder Other        paternal aunt and cousin   Drug abuse Other     Social History Social History   Tobacco Use   Smoking status: Every Day    Packs/day: 0.50    Years: 2.00    Pack years: 1.00    Types: Cigarettes    Last attempt to quit: 11/27/2012    Years since quitting: 8.8   Smokeless tobacco:  Former    Types: Chew   Tobacco comments:    quit in 04/2015  Vaping Use   Vaping Use: Never used  Substance Use Topics   Alcohol use: No    Alcohol/week: 0.0 standard drinks   Drug use: No     Allergies   Tylenol [acetaminophen]   Review of Systems Review of Systems  Respiratory:  Positive for cough.   All other systems reviewed and are negative.   Physical Exam Triage Vital Signs ED Triage Vitals  Enc Vitals Group     BP 10/03/21 0915 118/82     Pulse Rate 10/03/21 0915 70     Resp 10/03/21 0915 18     Temp 10/03/21 0915 97.9 F (36.6 C)     Temp Source 10/03/21 0915 Oral     SpO2 10/03/21 0915 98 %     Weight --      Height --      Head Circumference --      Peak Flow --      Pain Score 10/03/21 0858 10     Pain Loc --      Pain Edu? --      Excl. in GC? --    No data found.  Updated Vital Signs BP 118/82 (BP Location: Right Arm)   Pulse 70   Temp 97.9 F (36.6 C) (Oral)   Resp 18   LMP 09/30/2021   SpO2 98%   Visual Acuity Right Eye Distance:   Left Eye Distance:   Bilateral Distance:    Right Eye Near:   Left Eye Near:    Bilateral Near:     Physical Exam Vitals reviewed.  Constitutional:      General: She is not in acute distress.    Appearance: Normal appearance. She is not ill-appearing.  HENT:     Head: Normocephalic and atraumatic.     Right Ear: Tympanic membrane, ear canal and external ear normal. No tenderness. No middle ear effusion. There is no impacted cerumen. Tympanic membrane is not perforated, erythematous, retracted or bulging.     Left Ear: Tympanic membrane, ear canal and external ear normal. No tenderness.  No middle ear effusion. There is no impacted cerumen. Tympanic membrane is not perforated, erythematous, retracted or bulging.     Nose: Nose normal. No congestion.     Mouth/Throat:     Mouth: Mucous membranes are moist.     Pharynx: Uvula midline. No oropharyngeal exudate or posterior  oropharyngeal erythema.   Eyes:     Extraocular Movements: Extraocular movements intact.     Pupils: Pupils are equal, round, and reactive to light.  Cardiovascular:     Rate and Rhythm: Normal rate and regular rhythm.     Heart sounds: Normal heart sounds.  Pulmonary:     Effort: Pulmonary effort is normal.     Breath sounds: Normal breath sounds. No decreased breath sounds, wheezing, rhonchi or rales.     Comments: Clear to auscultation  Chest:     Chest wall: Tenderness present.     Comments: TTP R anterior chest wall immediately inferior to R breast. No ecchymosis or obvious bony deformity. Abdominal:     Palpations: Abdomen is soft.     Tenderness: There is no abdominal tenderness. There is no right CVA tenderness, left CVA tenderness, guarding or rebound. Negative signs include Murphy's sign, Rovsing's sign and McBurney's sign.     Comments: No abd pain to palpation, but there is rib pain immediately above the RUQ.  Lymphadenopathy:     Cervical: No cervical adenopathy.     Right cervical: No superficial cervical adenopathy.    Left cervical: No superficial cervical adenopathy.  Neurological:     General: No focal deficit present.     Mental Status: She is alert and oriented to person, place, and time.  Psychiatric:        Mood and Affect: Mood normal.        Behavior: Behavior normal.        Thought Content: Thought content normal.        Judgment: Judgment normal.     UC Treatments / Results  Labs (all labs ordered are listed, but only abnormal results are displayed) Labs Reviewed - No data to display  EKG   Radiology DG Ribs Unilateral W/Chest Right  Result Date: 10/03/2021 CLINICAL DATA:  Right-sided rib pain for 2 weeks. EXAM: RIGHT RIBS AND CHEST - 3+ VIEW COMPARISON:  None. FINDINGS: No fracture or other bone lesions are seen involving the ribs. There is no evidence of pneumothorax or pleural effusion. Both lungs are clear. Heart size and mediastinal contours are within normal limits.  IMPRESSION: Negative. Electronically Signed   By: Lupita Raider M.D.   On: 10/03/2021 09:23    Procedures Procedures (including critical care time)  Medications Ordered in UC Medications - No data to display  Initial Impression / Assessment and Plan / UC Course  I have reviewed the triage vital signs and the nursing notes.  Pertinent labs & imaging results that were available during my care of the patient were reviewed by me and considered in my medical decision making (see chart for details).     This patient is a very pleasant 26 y.o. year old female presenting with muscular strain - R anterior chest wall. Afebrile, nontachy. No adventitious breath sounds. LMP 09/30/21, States she is not pregnant or breastfeeding.  CXR - negative .  Pain is reproducible to palpation. Pain is out of proportion to exam; discussed that there is a small chance that she could have an atypical presentation of cholecystitis, and I cannot completely rule this out in the UC setting. Patient verbalizes understanding and wishes to proceed with muscle relaxer and ibuprofen for now.   ED return precautions discussed. Patient verbalizes understanding and agreement.     Final Clinical Impressions(s) / UC Diagnoses   Final diagnoses:  Muscle strain     Discharge Instructions      -  Start the muscle relaxer-Zanaflex (tizanidine), up to 3 times daily for muscle spasms and pain.  This can make you drowsy, so take at bedtime or when you do not need to drive or operate machinery. -Ice pack -You can take Tylenol up to 1000 mg 3 times daily, and ibuprofen up to 600 mg 3 times daily with food.  You can take these together, or alternate every 3-4 hours. -The pain seems to be localized over your ribs, but I cannot completely rule out an abdominal issue like gallbladder disease.  If your symptoms get worse instead of better with treatment, hfead to the emergency department for further imaging.  Also head to the emergency  department if symptoms get worse including shortness of breath or any chest pain at rest.   ED Prescriptions     Medication Sig Dispense Auth. Provider   tizanidine (ZANAFLEX) 2 MG capsule Take 1 capsule (2 mg total) by mouth 3 (three) times daily. 21 capsule Rhys Martini, PA-C      PDMP not reviewed this encounter.   Rhys Martini, PA-C 10/03/21 [phone]
